# Patient Record
Sex: Male | Born: 1960 | Race: White | Hispanic: No | Smoking: Never smoker
Health system: Southern US, Community
[De-identification: ages and names within clinical notes are randomized; demographics above are authoritative.]

## PROBLEM LIST (undated history)

## (undated) DIAGNOSIS — F329 Major depressive disorder, single episode, unspecified: Secondary | ICD-10-CM

## (undated) DIAGNOSIS — K219 Gastro-esophageal reflux disease without esophagitis: Secondary | ICD-10-CM

## (undated) DIAGNOSIS — I10 Essential (primary) hypertension: Secondary | ICD-10-CM

## (undated) DIAGNOSIS — F32A Depression, unspecified: Secondary | ICD-10-CM

## (undated) DIAGNOSIS — F988 Other specified behavioral and emotional disorders with onset usually occurring in childhood and adolescence: Secondary | ICD-10-CM

## (undated) HISTORY — PX: VASECTOMY: SHX75

## (undated) HISTORY — PX: EYE SURGERY: SHX253

## (undated) HISTORY — DX: Gastro-esophageal reflux disease without esophagitis: K21.9

---
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
## 2004-11-28 ENCOUNTER — Ambulatory Visit: Payer: Self-pay | Admitting: Family Medicine

## 2011-09-24 ENCOUNTER — Encounter: Payer: Self-pay | Admitting: Family Medicine

## 2013-04-01 ENCOUNTER — Ambulatory Visit: Payer: Self-pay | Admitting: Family Medicine

## 2014-10-17 LAB — HEMOGLOBIN A1C: HEMOGLOBIN A1C: 5.7 % (ref 4.0–6.0)

## 2015-01-19 LAB — LIPID PANEL
CHOLESTEROL: 210 mg/dL — AB (ref 0–200)
HDL: 51 mg/dL (ref 35–70)
LDL Cholesterol: 148 mg/dL
TRIGLYCERIDES: 57 mg/dL (ref 40–160)

## 2015-01-19 LAB — HEPATIC FUNCTION PANEL
ALT: 30 U/L (ref 10–40)
AST: 28 U/L (ref 14–40)
Alkaline Phosphatase: 53 U/L (ref 25–125)
BILIRUBIN, TOTAL: 0.4 mg/dL

## 2015-01-19 LAB — CBC AND DIFFERENTIAL
HCT: 48 % (ref 41–53)
Hemoglobin: 15.4 g/dL (ref 13.5–17.5)
NEUTROS ABS: 54 /uL
Platelets: 255 10*3/uL (ref 150–399)
WBC: 3.8 10^3/mL

## 2015-01-19 LAB — BASIC METABOLIC PANEL
BUN: 20 mg/dL (ref 4–21)
CREATININE: 0.9 mg/dL (ref 0.6–1.3)
Glucose: 110 mg/dL
POTASSIUM: 5.1 mmol/L (ref 3.4–5.3)
SODIUM: 142 mmol/L (ref 137–147)

## 2015-01-19 LAB — TSH: TSH: 0.76 u[IU]/mL (ref 0.41–5.90)

## 2015-04-01 ENCOUNTER — Other Ambulatory Visit: Payer: Self-pay | Admitting: Family Medicine

## 2015-05-10 ENCOUNTER — Encounter: Payer: Self-pay | Admitting: Family Medicine

## 2015-07-05 ENCOUNTER — Other Ambulatory Visit: Payer: Self-pay

## 2015-07-05 MED ORDER — BUPROPION HCL ER (XL) 300 MG PO TB24: 300.0000 mg | ORAL_TABLET | Freq: Every day | ORAL | Status: DC

## 2015-07-18 DIAGNOSIS — R7303 Prediabetes: Secondary | ICD-10-CM | POA: Insufficient documentation

## 2015-07-18 DIAGNOSIS — F32A Depression, unspecified: Secondary | ICD-10-CM | POA: Insufficient documentation

## 2015-07-18 DIAGNOSIS — F988 Other specified behavioral and emotional disorders with onset usually occurring in childhood and adolescence: Secondary | ICD-10-CM | POA: Insufficient documentation

## 2015-07-18 DIAGNOSIS — F329 Major depressive disorder, single episode, unspecified: Secondary | ICD-10-CM | POA: Insufficient documentation

## 2015-07-18 DIAGNOSIS — R739 Hyperglycemia, unspecified: Secondary | ICD-10-CM | POA: Insufficient documentation

## 2015-07-18 DIAGNOSIS — M7540 Impingement syndrome of unspecified shoulder: Secondary | ICD-10-CM | POA: Insufficient documentation

## 2015-07-18 DIAGNOSIS — E785 Hyperlipidemia, unspecified: Secondary | ICD-10-CM | POA: Insufficient documentation

## 2015-07-18 DIAGNOSIS — K219 Gastro-esophageal reflux disease without esophagitis: Secondary | ICD-10-CM | POA: Insufficient documentation

## 2015-07-18 DIAGNOSIS — D1720 Benign lipomatous neoplasm of skin and subcutaneous tissue of unspecified limb: Secondary | ICD-10-CM | POA: Insufficient documentation

## 2015-07-19 ENCOUNTER — Ambulatory Visit (INDEPENDENT_AMBULATORY_CARE_PROVIDER_SITE_OTHER): Payer: BC Managed Care – PPO | Admitting: Family Medicine

## 2015-07-19 ENCOUNTER — Encounter: Payer: Self-pay | Admitting: Family Medicine

## 2015-07-19 VITALS — BP 118/60 | HR 60 | Temp 98.6°F | Resp 16 | Ht 65.0 in | Wt 158.0 lb

## 2015-07-19 DIAGNOSIS — F329 Major depressive disorder, single episode, unspecified: Secondary | ICD-10-CM | POA: Diagnosis not present

## 2015-07-19 DIAGNOSIS — F32A Depression, unspecified: Secondary | ICD-10-CM

## 2015-07-19 MED ORDER — SERTRALINE HCL 50 MG PO TABS: 50.0000 mg | ORAL_TABLET | Freq: Every day | ORAL | Status: DC

## 2015-07-19 NOTE — Progress Notes (Signed)
Patient ID: Darryl Larson, male   DOB: [birth year], 54 y.o.   MRN: [id number]       Patient: Darryl Larson Male    DOB: Dec 17, 1960   54 y.o.   MRN: [id number] Visit Date: 07/20/2015  Today's Provider: Wilhemena Durie, MD   Chief Complaint  Patient presents with  . Depression    6 month f/u.    Subjective:    Depression        This is a chronic problem.  Associated symptoms include decreased concentration, fatigue, insomnia and appetite change.  Associated symptoms include no helplessness, no hopelessness, not irritable, no restlessness, no decreased interest, no body aches, no myalgias, no indigestion, not sad and no suicidal ideas.  Compliance with treatment is good.  Previous treatment provided moderate relief. Patient is here for his 6 month follow up. Patient mentions that he feels really fatigued, and has trouble finding interest in things. Patient currently takes Wellbutrin 300mg  once daily.      Allergies no known allergies Previous Medications   ASPIRIN 81 MG TABLET    Take by mouth.   BUPROPION (WELLBUTRIN XL) 300 MG 24 HR TABLET    Take 1 tablet (300 mg total) by mouth daily.   CINNAMON 500 MG TABS    Take by mouth.   CLOBETASOL CREAM (TEMOVATE) 0.05 %    APPLY TWICE DAILY TO RASH   FLUTICASONE (FLONASE) 50 MCG/ACT NASAL SPRAY    USE 2 SPRAYS IN EACH NOSTRIL EVERY DAY   HYDROCORTISONE (ANUSOL-HC) 25 MG SUPPOSITORY    Place rectally.   MELATONIN 1 MG TABS    Take by mouth.   MULTIPLE VITAMIN PO    Take by mouth.   NAPROXEN (NAPROSYN) 500 MG TABLET    Take by mouth.    Review of Systems  Constitutional: Positive for appetite change and fatigue.  HENT: Negative.   Respiratory: Negative.   Cardiovascular: Negative.   Gastrointestinal: Negative.   Musculoskeletal: Negative for myalgias.  Psychiatric/Behavioral: Positive for depression, dysphoric mood and decreased concentration. Negative for suicidal ideas. The patient has insomnia.     Social History  Substance  Use Topics  . Smoking status: Never Smoker   . Smokeless tobacco: Former Systems developer  . Alcohol Use: 0.0 oz/week    0 Standard drinks or equivalent per week     Comment: OCCASIONALLY   Objective:   BP 118/60 mmHg  Pulse 60  Temp(Src) 98.6 F (37 C)  Resp 16  Ht 5\' 5"  (1.651 m)  Wt 158 lb (71.668 kg)  BMI 26.29 kg/m2  Physical Exam  Constitutional: He is oriented to person, place, and time. He appears well-developed and well-nourished. He is not irritable.  HENT:  Head: Normocephalic and atraumatic.  Right Ear: External ear normal.  Left Ear: External ear normal.  Nose: Nose normal.  Eyes: Conjunctivae are normal.  Neck: Neck supple.  Cardiovascular: Normal rate, regular rhythm and normal heart sounds.   Pulmonary/Chest: Effort normal and breath sounds normal.  Abdominal: Soft.  Neurological: He is alert and oriented to person, place, and time.  Skin: Skin is warm and dry.  Psychiatric: He has a normal mood and affect. His behavior is normal. Judgment and thought content normal.        Assessment & Plan:       Depression Start Sertraline 50mg  daily--RTC 1 month.     I have done the exam and reviewed the above chart and it is accurate to the best of  my knowledge.   Denene Alamillo Cranford Mon, MD  [facility]

## 2015-07-20 ENCOUNTER — Telehealth: Payer: Self-pay | Admitting: Family Medicine

## 2015-07-20 NOTE — Telephone Encounter (Signed)
Please review-aa 

## 2015-07-20 NOTE — Telephone Encounter (Signed)
Pt advised-aa 

## 2015-07-20 NOTE — Telephone Encounter (Signed)
Both

## 2015-07-20 NOTE — Telephone Encounter (Signed)
Pt came in yesterday and you gave him generic Zoloft. Does he need to discontinue the Wellbutrin or take both  Please Advise  .[place]

## 2015-08-23 ENCOUNTER — Ambulatory Visit (INDEPENDENT_AMBULATORY_CARE_PROVIDER_SITE_OTHER): Payer: BC Managed Care – PPO | Admitting: Family Medicine

## 2015-08-23 VITALS — BP 106/56 | HR 64 | Temp 98.1°F | Resp 14 | Ht 65.0 in | Wt 158.0 lb

## 2015-08-23 DIAGNOSIS — F329 Major depressive disorder, single episode, unspecified: Secondary | ICD-10-CM | POA: Diagnosis not present

## 2015-08-23 DIAGNOSIS — F32A Depression, unspecified: Secondary | ICD-10-CM

## 2015-08-23 MED ORDER — DULOXETINE HCL 20 MG PO CPEP: 20.0000 mg | ORAL_CAPSULE | Freq: Every day | ORAL | Status: DC

## 2015-08-23 NOTE — Progress Notes (Signed)
Patient ID: Darryl Larson, male   DOB: [birth year], 54 y.o.   MRN: [id number]   Darryl Larson  MRN: [id number] DOB: 15-Mar-1961  Subjective:  HPI   1. Depression Patient is a 54 year old male who presents for follow up on his depression.  His last office visit was on 07/19/15.  At that time the management changes included adding Sertraline 50 mg daily to his Bupropion.  Patient has been on this for about 1 month now but states he does not see any improvement in his symptoms.   Patient Active Problem List   Diagnosis Date Noted  . Adult attention deficit disorder 07/18/2015  . Clinical depression 07/18/2015  . Gastro-esophageal reflux disease without esophagitis 07/18/2015  . Borderline diabetes 07/18/2015  . Blood glucose elevated 07/18/2015  . HLD (hyperlipidemia) 07/18/2015  . Impingement syndrome of shoulder 07/18/2015  . Lipoma of shoulder 07/18/2015    No past medical history on file.  Social History   Social History  . Marital Status: Divorced    Spouse Name: N/A  . Number of Children: N/A  . Years of Education: N/A   Occupational History  . Not on file.   Social History Main Topics  . Smoking status: Never Smoker   . Smokeless tobacco: Former Systems developer  . Alcohol Use: 0.0 oz/week    0 Standard drinks or equivalent per week     Comment: OCCASIONALLY  . Drug Use: No  . Sexual Activity: Not on file   Other Topics Concern  . Not on file   Social History Narrative    Outpatient Prescriptions Prior to Visit  Medication Sig Dispense Refill  . aspirin 81 MG tablet Take by mouth.    Marland Kitchen buPROPion (WELLBUTRIN XL) 300 MG 24 hr tablet Take 1 tablet (300 mg total) by mouth daily. 90 tablet 3  . Cinnamon 500 MG TABS Take by mouth.    . clobetasol cream (TEMOVATE) 0.05 % APPLY TWICE DAILY TO RASH  3  . fluticasone (FLONASE) 50 MCG/ACT nasal spray USE 2 SPRAYS IN EACH NOSTRIL EVERY DAY 15 g 12  . hydrocortisone (ANUSOL-HC) 25 MG suppository Place rectally.    . Melatonin 1  MG TABS Take by mouth.    . MULTIPLE VITAMIN PO Take by mouth.    . naproxen (NAPROSYN) 500 MG tablet Take by mouth.    . sertraline (ZOLOFT) 50 MG tablet Take 1 tablet (50 mg total) by mouth daily. 30 tablet 12   No facility-administered medications prior to visit.    No Known Allergies  Review of Systems  Constitutional: Positive for malaise/fatigue (chronic and unchanged). Negative for fever, chills, weight loss and diaphoresis.  Respiratory: Negative.   Cardiovascular: Negative.   Neurological: Negative for dizziness, weakness and headaches.  Psychiatric/Behavioral: Positive for depression and memory loss. Negative for suicidal ideas, hallucinations and substance abuse. The patient is not nervous/anxious and does not have insomnia.    Objective:  BP 106/56 mmHg  Pulse 64  Temp(Src) 98.1 F (36.7 C) (Oral)  Resp 14  Ht 5\' 5"  (1.651 m)  Wt 158 lb (71.668 kg)  BMI 26.29 kg/m2  Physical Exam  Constitutional: He is well-developed, well-nourished, and in no distress.  HENT:  Head: Normocephalic and atraumatic.  Eyes: Pupils are equal, round, and reactive to light.  Neck: Normal range of motion.  Cardiovascular: Normal rate, regular rhythm and normal heart sounds.   Pulmonary/Chest: Effort normal and breath sounds normal.  Skin: Skin is warm and dry.  Psychiatric: Mood, memory, affect and judgment normal.    Assessment and Plan :   1. Depression Patient is to discontinue the Sertraline and start Duloxetine with his next dose.  He has been advised to take with food.  Pt to f/u with counselling with [facility].  Will follow up in 1 month  - DULoxetine (CYMBALTA) 20 MG capsule; Take 1 capsule (20 mg total) by mouth daily.  Dispense: 30 capsule; Refill: [facility] 08/23/2015 3:51 PM

## 2015-09-11 ENCOUNTER — Ambulatory Visit (INDEPENDENT_AMBULATORY_CARE_PROVIDER_SITE_OTHER): Payer: BC Managed Care – PPO | Admitting: Family Medicine

## 2015-09-11 VITALS — BP 128/80 | HR 64 | Temp 98.0°F | Resp 16 | Wt 161.0 lb

## 2015-09-11 DIAGNOSIS — J019 Acute sinusitis, unspecified: Secondary | ICD-10-CM

## 2015-09-11 MED ORDER — AMOXICILLIN-POT CLAVULANATE 875-125 MG PO TABS: 1.0000 | ORAL_TABLET | Freq: Two times a day (BID) | ORAL | Status: DC

## 2015-09-11 NOTE — Progress Notes (Signed)
Patient ID: BRAXSTON BELLEW, male   DOB: 12/09/1960, 54 y.o.   MRN: [id number]   MARQUINN DIETEL  MRN: [id number] DOB: Aug 14, 1961  Subjective:  HPI   1. Acute sinusitis, recurrence not specified, unspecified location Patient is a 54 year old male who presents for evaluation of sinus symptoms.  He reports that 3 days ago he started with sinus drainage, cough and sinus pressure.  His cough is productive of green sputum.  Patient Active Problem List   Diagnosis Date Noted  . Adult attention deficit disorder 07/18/2015  . Clinical depression 07/18/2015  . Gastro-esophageal reflux disease without esophagitis 07/18/2015  . Borderline diabetes 07/18/2015  . Blood glucose elevated 07/18/2015  . HLD (hyperlipidemia) 07/18/2015  . Impingement syndrome of shoulder 07/18/2015  . Lipoma of shoulder 07/18/2015    No past medical history on file.  Social History   Social History  . Marital Status: Divorced    Spouse Name: N/A  . Number of Children: N/A  . Years of Education: N/A   Occupational History  . Not on file.   Social History Main Topics  . Smoking status: Never Smoker   . Smokeless tobacco: Former Systems developer  . Alcohol Use: 0.0 oz/week    0 Standard drinks or equivalent per week     Comment: OCCASIONALLY  . Drug Use: No  . Sexual Activity: Not on file   Other Topics Concern  . Not on file   Social History Narrative    Outpatient Prescriptions Prior to Visit  Medication Sig Dispense Refill  . aspirin 81 MG tablet Take by mouth.    Marland Kitchen buPROPion (WELLBUTRIN XL) 300 MG 24 hr tablet Take 1 tablet (300 mg total) by mouth daily. 90 tablet 3  . Cinnamon 500 MG TABS Take by mouth.    . clobetasol cream (TEMOVATE) 0.05 % APPLY TWICE DAILY TO RASH  3  . DULoxetine (CYMBALTA) 20 MG capsule Take 1 capsule (20 mg total) by mouth daily. 30 capsule 3  . fluticasone (FLONASE) 50 MCG/ACT nasal spray USE 2 SPRAYS IN EACH NOSTRIL EVERY DAY 15 g 12  . hydrocortisone (ANUSOL-HC) 25 MG  suppository Place rectally.    . Melatonin 1 MG TABS Take by mouth.    . MULTIPLE VITAMIN PO Take by mouth.    . naproxen (NAPROSYN) 500 MG tablet Take by mouth.     No facility-administered medications prior to visit.    No Known Allergies  Review of Systems  Constitutional: Negative for fever, chills, weight loss, malaise/fatigue and diaphoresis.  HENT: Positive for congestion and sore throat. Negative for ear discharge, ear pain, hearing loss, nosebleeds and tinnitus.   Eyes: Negative for blurred vision, double vision, photophobia, pain, discharge and redness.  Respiratory: Positive for cough and sputum production. Negative for hemoptysis, shortness of breath, wheezing and stridor.   Cardiovascular: Negative for chest pain, palpitations, orthopnea and leg swelling.  Gastrointestinal: Negative.   Genitourinary: Negative.   Neurological: Negative for weakness and headaches.  Endo/Heme/Allergies: Negative.   Psychiatric/Behavioral: Negative.    Objective:  BP 128/80 mmHg  Pulse 64  Temp(Src) 98 F (36.7 C) (Oral)  Resp 16  Wt 161 lb (73.029 kg)  Physical Exam  Constitutional: He is oriented to person, place, and time and well-developed, well-nourished, and in no distress.  HENT:  Head: Normocephalic and atraumatic.  Right Ear: External ear normal.  Left Ear: External ear normal.  Nose: Nose normal.  Mouth/Throat: Oropharynx is clear and moist.  Eyes: Conjunctivae  are normal.  Neck: Neck supple.  Cardiovascular: Normal rate, regular rhythm and normal heart sounds.   Pulmonary/Chest: Effort normal and breath sounds normal.  Abdominal: Soft.  Neurological: He is alert and oriented to person, place, and time.  Skin: Skin is warm and dry.  Psychiatric: Mood, memory, affect and judgment normal.    Assessment and Plan :  Acute sinusitis, recurrence not specified, unspecified location Augmentin BID. Miguel Aschoff MD [facility] 09/11/2015 4:52 PM

## 2015-09-27 ENCOUNTER — Ambulatory Visit: Payer: BC Managed Care – PPO | Admitting: Family Medicine

## 2015-11-06 ENCOUNTER — Other Ambulatory Visit: Payer: Self-pay

## 2015-11-25 ENCOUNTER — Encounter: Payer: Self-pay | Admitting: Family Medicine

## 2015-11-25 ENCOUNTER — Ambulatory Visit (INDEPENDENT_AMBULATORY_CARE_PROVIDER_SITE_OTHER): Payer: BC Managed Care – PPO | Admitting: Family Medicine

## 2015-11-25 VITALS — BP 124/60 | HR 56 | Temp 98.2°F | Resp 16 | Wt 162.0 lb

## 2015-11-25 DIAGNOSIS — R7303 Prediabetes: Secondary | ICD-10-CM | POA: Diagnosis not present

## 2015-11-25 DIAGNOSIS — J40 Bronchitis, not specified as acute or chronic: Secondary | ICD-10-CM

## 2015-11-25 MED ORDER — AZITHROMYCIN 250 MG PO TABS: ORAL_TABLET | ORAL | Status: DC

## 2015-11-25 NOTE — Progress Notes (Signed)
Patient ID: Darryl Larson, male   DOB: [birth year], 55 y.o.   MRN: [id number]       Patient: Darryl Larson Male    DOB: 09-Aug-1961   55 y.o.   MRN: [id number] Visit Date: 11/26/2015  Today's Provider: Margarita Rana, MD   Chief Complaint  Patient presents with  . URI    X 5 days.    Subjective:    URI  This is a new problem. The current episode started in the past 7 days. The problem has been gradually worsening. There has been no fever. Associated symptoms include congestion, coughing, headaches, rhinorrhea, sinus pain and a sore throat. Pertinent negatives include no chest pain, joint pain, neck pain, plugged ear sensation or wheezing. He has tried decongestant and antihistamine for the symptoms. The treatment provided no relief.       No Known Allergies Previous Medications   ASPIRIN 81 MG TABLET    Take by mouth.   BUPROPION (WELLBUTRIN XL) 300 MG 24 HR TABLET    Take 1 tablet (300 mg total) by mouth daily.   CINNAMON 500 MG TABS    Take by mouth.   CLOBETASOL CREAM (TEMOVATE) 0.05 %    APPLY TWICE DAILY TO RASH   DULOXETINE (CYMBALTA) 20 MG CAPSULE    Take 1 capsule (20 mg total) by mouth daily.   FLUTICASONE (FLONASE) 50 MCG/ACT NASAL SPRAY    USE 2 SPRAYS IN EACH NOSTRIL EVERY DAY   HYDROCORTISONE (ANUSOL-HC) 25 MG SUPPOSITORY    Place rectally.   MELATONIN 1 MG TABS    Take by mouth.   MULTIPLE VITAMIN PO    Take by mouth.   NAPROXEN (NAPROSYN) 500 MG TABLET    Take by mouth.    Review of Systems  HENT: Positive for congestion, rhinorrhea and sore throat.   Respiratory: Positive for cough. Negative for wheezing.   Cardiovascular: Negative for chest pain.  Musculoskeletal: Negative for joint pain and neck pain.  Neurological: Positive for headaches.    Social History  Substance Use Topics  . Smoking status: Never Smoker   . Smokeless tobacco: Former Systems developer  . Alcohol Use: 0.0 oz/week    0 Standard drinks or equivalent per week     Comment: OCCASIONALLY    Objective:   BP 124/60 mmHg  Pulse 56  Temp(Src) 98.2 F (36.8 C)  Resp 16  Wt 162 lb (73.483 kg)  SpO2 97%  Physical Exam  Constitutional: He is oriented to person, place, and time. He appears well-developed and well-nourished.  HENT:  Head: Normocephalic and atraumatic.  Right Ear: Tympanic membrane and external ear normal.  Left Ear: Tympanic membrane and external ear normal.  Nose: Mucosal edema and rhinorrhea present. Right sinus exhibits no maxillary sinus tenderness. Left sinus exhibits no maxillary sinus tenderness.  Mouth/Throat: Uvula is midline and oropharynx is clear and moist.  Eyes: Conjunctivae and EOM are normal. Pupils are equal, round, and reactive to light.  Neck: Normal range of motion. Neck supple.  Cardiovascular: Normal rate and regular rhythm.   Pulmonary/Chest: Effort normal and breath sounds normal. He has no wheezes. He has no rales.  Neurological: He is alert and oriented to person, place, and time.        Assessment & Plan:     1. Bronchitis New problem. Condition is worsening. Will start medication for better control.  Has borderline diabetes, so will treat slightly more aggressively. Just not improving. Patient instructed to call back if condition  worsens or does not improve.    - azithromycin (ZITHROMAX) 250 MG tablet; 2 today and then one a day for 4 more days.  Dispense: 6 tablet; Refill: 0  2. Borderline diabetes Follow up with PCP.         Margarita Rana, MD  [facility]

## 2015-12-11 ENCOUNTER — Other Ambulatory Visit: Payer: Self-pay

## 2015-12-11 DIAGNOSIS — F329 Major depressive disorder, single episode, unspecified: Secondary | ICD-10-CM

## 2015-12-11 DIAGNOSIS — F32A Depression, unspecified: Secondary | ICD-10-CM

## 2015-12-11 MED ORDER — DULOXETINE HCL 20 MG PO CPEP: 20.0000 mg | ORAL_CAPSULE | Freq: Every day | ORAL | Status: DC

## 2016-01-22 ENCOUNTER — Encounter: Payer: Self-pay | Admitting: Family Medicine

## 2016-01-22 ENCOUNTER — Ambulatory Visit (INDEPENDENT_AMBULATORY_CARE_PROVIDER_SITE_OTHER): Payer: BC Managed Care – PPO | Admitting: Family Medicine

## 2016-01-22 VITALS — BP 118/64 | HR 60 | Temp 98.4°F | Resp 12 | Ht 64.5 in | Wt 163.0 lb

## 2016-01-22 DIAGNOSIS — Z1211 Encounter for screening for malignant neoplasm of colon: Secondary | ICD-10-CM | POA: Diagnosis not present

## 2016-01-22 DIAGNOSIS — F329 Major depressive disorder, single episode, unspecified: Secondary | ICD-10-CM

## 2016-01-22 DIAGNOSIS — E785 Hyperlipidemia, unspecified: Secondary | ICD-10-CM | POA: Diagnosis not present

## 2016-01-22 DIAGNOSIS — G478 Other sleep disorders: Secondary | ICD-10-CM

## 2016-01-22 DIAGNOSIS — Z0000 Encounter for general adult medical examination without abnormal findings: Secondary | ICD-10-CM | POA: Diagnosis not present

## 2016-01-22 DIAGNOSIS — R7303 Prediabetes: Secondary | ICD-10-CM

## 2016-01-22 DIAGNOSIS — F32A Depression, unspecified: Secondary | ICD-10-CM

## 2016-01-22 DIAGNOSIS — Z125 Encounter for screening for malignant neoplasm of prostate: Secondary | ICD-10-CM | POA: Diagnosis not present

## 2016-01-22 DIAGNOSIS — M7702 Medial epicondylitis, left elbow: Secondary | ICD-10-CM | POA: Diagnosis not present

## 2016-01-22 NOTE — Progress Notes (Signed)
Patient ID: Darryl Larson, male   DOB: 05-29-1961, 55 y.o.   MRN: [id number]  Visit Date: 01/22/2016  Today's Provider: Wilhemena Durie, MD   Chief Complaint  Patient presents with  . Annual Exam   Subjective:  Darryl Larson is a 55 y.o. male who presents today for health maintenance and complete physical. He feels well. He stays active daily with house and yard work.Marland Kitchen He reports he is sleeping poorly. Immunization History  Administered Date(s) Administered  . Pneumococcal Polysaccharide-23 12/02/2012  . Tdap 11/27/2011   Last colonoscopy 09/24/11 diverticulosis, tubular adenoma.  Review of Systems  Constitutional: Negative.   HENT: Negative.   Eyes: Negative.   Respiratory: Negative.   Cardiovascular: Negative.   Gastrointestinal: Positive for constipation.  Endocrine: Negative.   Genitourinary: Negative.   Musculoskeletal: Positive for back pain and neck stiffness.  Skin: Negative.   Allergic/Immunologic: Negative.   Neurological: Negative.   Hematological: Negative.   Psychiatric/Behavioral: Positive for sleep disturbance.    Social History   Social History  . Marital Status: Divorced    Spouse Name: N/A  . Number of Children: N/A  . Years of Education: N/A   Occupational History  . Not on file.   Social History Main Topics  . Smoking status: Never Smoker   . Smokeless tobacco: Former Systems developer    Types: Chew  . Alcohol Use: 0.0 oz/week    0 Standard drinks or equivalent per week     Comment: 3 to 4 drinks a week  . Drug Use: No  . Sexual Activity: Not on file   Other Topics Concern  . Not on file   Social History Narrative    Patient Active Problem List   Diagnosis Date Noted  . Adult attention deficit disorder 07/18/2015  . Clinical depression 07/18/2015  . Gastro-esophageal reflux disease without esophagitis 07/18/2015  . Borderline diabetes 07/18/2015  . Blood glucose elevated 07/18/2015  . HLD (hyperlipidemia) 07/18/2015  . Impingement  syndrome of shoulder 07/18/2015  . Lipoma of shoulder 07/18/2015    Past Surgical History  Procedure Laterality Date  . Vasectomy      His family history includes Alcohol abuse in his father; Breast cancer in his mother; Cancer in his paternal aunt; Depression in his brother, brother, and father; Diabetes in his mother; Healthy in his brother and daughter; Hodgkin's lymphoma in his paternal uncle; Pancreatic cancer in his mother; Throat cancer in his father.    Outpatient Prescriptions Prior to Visit  Medication Sig Dispense Refill  . aspirin 81 MG tablet Take by mouth.    Marland Kitchen buPROPion (WELLBUTRIN XL) 300 MG 24 hr tablet Take 1 tablet (300 mg total) by mouth daily. 90 tablet 3  . Cinnamon 500 MG TABS Take by mouth.    . clobetasol cream (TEMOVATE) 0.05 % APPLY TWICE DAILY TO RASH  3  . DULoxetine (CYMBALTA) 20 MG capsule Take 1 capsule (20 mg total) by mouth daily. 90 capsule 1  . fluticasone (FLONASE) 50 MCG/ACT nasal spray USE 2 SPRAYS IN EACH NOSTRIL EVERY DAY 15 g 12  . MULTIPLE VITAMIN PO Take by mouth.    . Melatonin 1 MG TABS Take by mouth. Reported on 01/22/2016    . azithromycin (ZITHROMAX) 250 MG tablet 2 today and then one a day for 4 more days. 6 tablet 0  . hydrocortisone (ANUSOL-HC) 25 MG suppository Place rectally.    . naproxen (NAPROSYN) 500 MG tablet Take by mouth.     No  facility-administered medications prior to visit.    Patient Care Team: [name]., MD as PCP - General (Family Medicine)     Objective:   Vitals:  Filed Vitals:   01/22/16 0913  BP: 118/64  Pulse: 60  Temp: 98.4 F (36.9 C)  Resp: 12  Height: 5' 4.5" (1.638 m)  Weight: 163 lb (73.936 kg)    Physical Exam  Constitutional: He is oriented to person, place, and time. He appears well-developed and well-nourished.  HENT:  Head: Normocephalic and atraumatic.  Right Ear: External ear normal.  Left Ear: External ear normal.  Nose: Nose normal.  Mouth/Throat: Oropharynx is clear  and moist.  Eyes: Conjunctivae are normal. Pupils are equal, round, and reactive to light.  Neck: Normal range of motion. Neck supple.  Cardiovascular: Normal rate, regular rhythm, normal heart sounds and intact distal pulses.   No murmur heard. Pulmonary/Chest: Effort normal and breath sounds normal. No respiratory distress.  Abdominal: Soft. He exhibits no distension. There is no tenderness.  Genitourinary: Rectum normal, prostate normal and penis normal. Guaiac negative stool. No penile tenderness.  Musculoskeletal: Normal range of motion. He exhibits no edema.  Neurological: He is alert and oriented to person, place, and time. No cranial nerve deficit.  Skin: Skin is warm and dry. No rash noted. No erythema.  Psychiatric: He has a normal mood and affect. His behavior is normal. Judgment and thought content normal.  golf ball size lipoma over right AC  Joint.   Depression Screen PHQ 2/9 Scores 01/22/2016 07/19/2015  PHQ - 2 Score 1 3  PHQ- 9 Score - 14      Assessment & Plan:   1. Annual physical exam - CBC with Differential/Platelet - Comprehensive metabolic panel - Lipid Panel With LDL/HDL Ratio - POCT Urinalysis Dipstick - TSH  2. Colon cancer screening - IFOBT POC (occult bld, rslt in office)  3. Prostate cancer screening - PSA  4. Borderline diabetes - HgB A1c  5. Clinical depression Stable.  6. HLD (hyperlipidemia)  7. Epicondylitis elbow, medial, left New. Refer to Dr. Mack Guise. - Ambulatory referral to Orthopedic Surgery  8. Poor sleep pattern Discussed working on sleep hygiene methods and will re check in June. Epworth today is 15. Patient wants to follow for now. 9 lipoma over right AC joint I have done the exam and reviewed the above chart and it is accurate to the best of my knowledge.  Patient was seen and examined by Dr. Eulas Post and note was scribed by Theressa Millard, RMA.

## 2016-01-30 LAB — COMPREHENSIVE METABOLIC PANEL
A/G RATIO: 1.8 (ref 1.2–2.2)
ALK PHOS: 62 IU/L (ref 39–117)
ALT: 29 IU/L (ref 0–44)
AST: 30 IU/L (ref 0–40)
Albumin: 4.4 g/dL (ref 3.5–5.5)
BILIRUBIN TOTAL: 0.5 mg/dL (ref 0.0–1.2)
BUN/Creatinine Ratio: 15 (ref 9–20)
BUN: 14 mg/dL (ref 6–24)
CALCIUM: 9.7 mg/dL (ref 8.7–10.2)
CO2: 28 mmol/L (ref 18–29)
Chloride: 101 mmol/L (ref 96–106)
Creatinine, Ser: 0.95 mg/dL (ref 0.76–1.27)
GFR calc Af Amer: 104 mL/min/{1.73_m2} (ref >59)
GFR calc non Af Amer: 90 mL/min/{1.73_m2} (ref >59)
Globulin, Total: 2.4 g/dL (ref 1.5–4.5)
Glucose: 122 mg/dL — ABNORMAL HIGH (ref 65–99)
POTASSIUM: 5.1 mmol/L (ref 3.5–5.2)
Sodium: 144 mmol/L (ref 134–144)
Total Protein: 6.8 g/dL (ref 6.0–8.5)

## 2016-01-30 LAB — LIPID PANEL WITH LDL/HDL RATIO
CHOLESTEROL TOTAL: 249 mg/dL — AB (ref 100–199)
HDL: 56 mg/dL (ref >39)
LDL Calculated: 168 mg/dL — ABNORMAL HIGH (ref 0–99)
LDl/HDL Ratio: 3 ratio units (ref 0.0–3.6)
Triglycerides: 125 mg/dL (ref 0–149)
VLDL Cholesterol Cal: 25 mg/dL (ref 5–40)

## 2016-01-30 LAB — CBC WITH DIFFERENTIAL/PLATELET
BASOS: 1 %
Basophils Absolute: 0 10*3/uL (ref 0.0–0.2)
EOS (ABSOLUTE): 0.1 10*3/uL (ref 0.0–0.4)
Eos: 3 %
Hematocrit: 49.5 % (ref 37.5–51.0)
Hemoglobin: 16.6 g/dL (ref 12.6–17.7)
IMMATURE GRANULOCYTES: 0 %
Immature Grans (Abs): 0 10*3/uL (ref 0.0–0.1)
Lymphocytes Absolute: 1.4 10*3/uL (ref 0.7–3.1)
Lymphs: 31 %
MCH: 30.2 pg (ref 26.6–33.0)
MCHC: 33.5 g/dL (ref 31.5–35.7)
MCV: 90 fL (ref 79–97)
MONOS ABS: 0.5 10*3/uL (ref 0.1–0.9)
Monocytes: 11 %
NEUTROS PCT: 54 %
Neutrophils Absolute: 2.3 10*3/uL (ref 1.4–7.0)
PLATELETS: 259 10*3/uL (ref 150–379)
RBC: 5.5 x10E6/uL (ref 4.14–5.80)
RDW: 13 % (ref 12.3–15.4)
WBC: 4.4 10*3/uL (ref 3.4–10.8)

## 2016-01-30 LAB — HEMOGLOBIN A1C
ESTIMATED AVERAGE GLUCOSE: 126 mg/dL
Hgb A1c MFr Bld: 6 % — ABNORMAL HIGH (ref 4.8–5.6)

## 2016-01-30 LAB — TSH: TSH: 0.894 u[IU]/mL (ref 0.450–4.500)

## 2016-01-30 LAB — PSA: Prostate Specific Ag, Serum: 0.8 ng/mL (ref 0.0–4.0)

## 2016-02-07 ENCOUNTER — Telehealth: Payer: Self-pay

## 2016-02-07 NOTE — Telephone Encounter (Signed)
-----   Message from [name]., MD sent at 02/06/2016  8:39 AM EDT ----- Labs stable. Prediabetes stable.

## 2016-02-07 NOTE — Telephone Encounter (Signed)
Patient advised as directed below. 

## 2016-04-01 ENCOUNTER — Encounter (INDEPENDENT_AMBULATORY_CARE_PROVIDER_SITE_OTHER): Payer: BC Managed Care – PPO | Admitting: Ophthalmology

## 2016-04-01 DIAGNOSIS — H35373 Puckering of macula, bilateral: Secondary | ICD-10-CM

## 2016-04-01 DIAGNOSIS — H43813 Vitreous degeneration, bilateral: Secondary | ICD-10-CM | POA: Diagnosis not present

## 2016-04-04 ENCOUNTER — Other Ambulatory Visit: Payer: Self-pay

## 2016-04-04 MED ORDER — FLUTICASONE PROPIONATE 50 MCG/ACT NA SUSP: 2.0000 | Freq: Every day | NASAL | Status: DC | PRN

## 2016-04-10 ENCOUNTER — Ambulatory Visit: Payer: BC Managed Care – PPO | Admitting: Family Medicine

## 2016-04-16 LAB — HM DIABETES EYE EXAM

## 2016-06-12 ENCOUNTER — Other Ambulatory Visit: Payer: Self-pay | Admitting: Family Medicine

## 2016-07-17 ENCOUNTER — Ambulatory Visit (INDEPENDENT_AMBULATORY_CARE_PROVIDER_SITE_OTHER): Payer: BC Managed Care – PPO | Admitting: Family Medicine

## 2016-07-17 VITALS — BP 132/62 | HR 72 | Temp 98.4°F | Resp 16 | Wt 162.0 lb

## 2016-07-17 DIAGNOSIS — M898X1 Other specified disorders of bone, shoulder: Secondary | ICD-10-CM | POA: Diagnosis not present

## 2016-07-17 DIAGNOSIS — F329 Major depressive disorder, single episode, unspecified: Secondary | ICD-10-CM | POA: Diagnosis not present

## 2016-07-17 DIAGNOSIS — F32A Depression, unspecified: Secondary | ICD-10-CM

## 2016-07-17 MED ORDER — NAPROXEN 500 MG PO TABS: 500.0000 mg | ORAL_TABLET | Freq: Two times a day (BID) | ORAL | 12 refills | Status: DC | PRN

## 2016-07-17 NOTE — Progress Notes (Signed)
Darryl Larson  MRN: [id number] DOB: 02-16-1961  Subjective:  HPI  Patient states he has had pain in the shoulder blade on the right for 4 weeks now. He has taking Ibuprofen 2 tablets daily, applied heat and ice off and on. He is having hard time sleeping due to the pain, also decreased range of motion with right arm. Denies numbness and tingling sensation. He states he is active every day at work and home and does lifting and pulling and mowing. He does not recall any specific injury for his symptoms now.  Patient Active Problem List   Diagnosis Date Noted  . Adult attention deficit disorder 07/18/2015  . Clinical depression 07/18/2015  . Gastro-esophageal reflux disease without esophagitis 07/18/2015  . Borderline diabetes 07/18/2015  . Blood glucose elevated 07/18/2015  . HLD (hyperlipidemia) 07/18/2015  . Impingement syndrome of shoulder 07/18/2015  . Lipoma of shoulder 07/18/2015    No past medical history on file.  Social History   Social History  . Marital status: Divorced    Spouse name: N/A  . Number of children: N/A  . Years of education: N/A   Occupational History  . Not on file.   Social History Main Topics  . Smoking status: Never Smoker  . Smokeless tobacco: Former Systems developer    Types: Chew  . Alcohol use 0.0 oz/week     Comment: 3 to 4 drinks a week  . Drug use: No  . Sexual activity: Not on file   Other Topics Concern  . Not on file   Social History Narrative  . No narrative on file    Outpatient Encounter Prescriptions as of 07/17/2016  Medication Sig Note  . aspirin 81 MG tablet Take by mouth. 07/18/2015: Received from: [facility]  . buPROPion (WELLBUTRIN XL) 300 MG 24 hr tablet TAKE 1 TABLET (300 MG TOTAL) BY MOUTH DAILY.   Marland Kitchen Cinnamon 500 MG TABS Take by mouth. 07/18/2015: Received from: [facility]  . clobetasol cream (TEMOVATE) 0.05 % APPLY TWICE DAILY TO RASH 07/19/2015: Received from: External Pharmacy  .  DULoxetine (CYMBALTA) 20 MG capsule Take 1 capsule (20 mg total) by mouth daily.   . fluticasone (FLONASE) 50 MCG/ACT nasal spray Place 2 sprays into both nostrils daily as needed for allergies or rhinitis.   . Melatonin 1 MG TABS Take by mouth. Reported on 01/22/2016 07/18/2015: Received from: [facility]  . MULTIPLE VITAMIN PO Take by mouth. 07/18/2015: Received from: [facility]   No facility-administered encounter medications on file as of 07/17/2016.     No Known Allergies  Review of Systems  Constitutional: Negative.   Respiratory: Negative.   Cardiovascular: Negative.   Gastrointestinal: Negative.   Musculoskeletal: Positive for joint pain and myalgias.  Skin: Negative.   Psychiatric/Behavioral: Negative.    Objective:  BP 132/62   Pulse 72   Temp 98.4 F (36.9 C)   Resp 16   Wt 162 lb (73.5 kg)   BMI 27.38 kg/m   Physical Exam  Constitutional: He is oriented to person, place, and time and well-developed, well-nourished, and in no distress.  HENT:  Head: Normocephalic and atraumatic.  Right Ear: External ear normal.  Left Ear: External ear normal.  Nose: Nose normal.  Cardiovascular: Normal rate and regular rhythm.   Pulmonary/Chest: Effort normal and breath sounds normal.  Abdominal: Soft.  Musculoskeletal: He exhibits tenderness. He exhibits no edema.  Very mild tenderness and some mild swelling of the paraspinal muscles  just inferior and medial to the right scapula.  Neurological: He is alert and oriented to person, place, and time.  Skin: Skin is warm and dry.  No rash.  Psychiatric: Mood, memory, affect and judgment normal.    Assessment and Plan :  1. Pain of right scapula Treat with Naproxen. May need referral, will follow as needed. I think this is completely muscular. 2. Clinical depression   HPI, Exam and A&P transcribed under direction and in the presence of Miguel Aschoff, MD.

## 2016-07-18 ENCOUNTER — Encounter: Payer: Self-pay | Admitting: Urology

## 2016-07-18 ENCOUNTER — Ambulatory Visit (INDEPENDENT_AMBULATORY_CARE_PROVIDER_SITE_OTHER): Payer: BC Managed Care – PPO | Admitting: Urology

## 2016-07-18 VITALS — BP 136/89 | HR 59 | Ht 64.5 in | Wt 159.0 lb

## 2016-07-18 DIAGNOSIS — F5231 Female orgasmic disorder: Secondary | ICD-10-CM

## 2016-07-18 DIAGNOSIS — IMO0002 Reserved for concepts with insufficient information to code with codable children: Secondary | ICD-10-CM

## 2016-07-18 DIAGNOSIS — R3129 Other microscopic hematuria: Secondary | ICD-10-CM

## 2016-07-18 DIAGNOSIS — R37 Sexual dysfunction, unspecified: Secondary | ICD-10-CM

## 2016-07-18 LAB — MICROSCOPIC EXAMINATION
BACTERIA UA: NONE SEEN
Epithelial Cells (non renal): NONE SEEN /hpf (ref 0–10)

## 2016-07-18 LAB — URINALYSIS, COMPLETE
BILIRUBIN UA: NEGATIVE
Glucose, UA: NEGATIVE
Ketones, UA: NEGATIVE
NITRITE UA: NEGATIVE
PH UA: 6.5 (ref 5.0–7.5)
Protein, UA: NEGATIVE
Specific Gravity, UA: 1.025 (ref 1.005–1.030)
UUROB: 0.2 mg/dL (ref 0.2–1.0)

## 2016-07-18 NOTE — Progress Notes (Signed)
07/18/2016 3:27 PM   Darryl Larson 18-Feb-1961 [id number]  Referring provider: [name]., MD [address] [place], [place] [address]  Chief Complaint  Patient presents with  . New Patient (Initial Visit)    Erectile Dysfunction    HPI: The patient is a 55 year old gentleman with a past medical history of previous vasectomy presents today to discuss His inability to climax. He states that he has not been sexually active until recently. Now he is having a problem where he is able to get and maintain an erection without difficulty however he finds himself unable to have an orgasm. He sometimes is able to obtain orgasm when he macerated but it takes an extended period of time. He is never able to have an orgasm during sex. He is bothered by this. Of note, he is on Cymbalta and has been for approximately one year. He just started weaning this medication to come off of it yesterday.   PSA: 0.8 in April 2017.  PMH: Past Medical History:  Diagnosis Date  . Acid reflux     Surgical History: Past Surgical History:  Procedure Laterality Date  . VASECTOMY      Home Medications:    Medication List       Accurate as of 07/18/16  3:27 PM. Always use your most recent med list.          aspirin 81 MG tablet Take by mouth.   buPROPion 300 MG 24 hr tablet Commonly known as:  WELLBUTRIN XL TAKE 1 TABLET (300 MG TOTAL) BY MOUTH DAILY.   Cinnamon 500 MG Tabs Take by mouth.   clobetasol cream 0.05 % Commonly known as:  TEMOVATE APPLY TWICE DAILY TO RASH   DULoxetine 20 MG capsule Commonly known as:  CYMBALTA Take 1 capsule (20 mg total) by mouth daily.   fluticasone 50 MCG/ACT nasal spray Commonly known as:  FLONASE Place 2 sprays into both nostrils daily as needed for allergies or rhinitis.   Melatonin 1 MG Tabs Take by mouth. Reported on 01/22/2016   MULTIPLE VITAMIN PO Take by mouth.   naproxen 500 MG tablet Commonly known as:   NAPROSYN Take 1 tablet (500 mg total) by mouth 2 (two) times daily as needed.   Red Yeast Rice 600 MG Caps Take by mouth.   zinc sulfate 220 (50 Zn) MG capsule Take 220 mg by mouth daily.       Allergies: No Known Allergies  Family History: Family History  Problem Relation Age of Onset  . Breast cancer Mother   . Diabetes Mother   . Pancreatic cancer Mother   . Throat cancer Father   . Alcohol abuse Father   . Depression Father   . Healthy Brother   . Healthy Daughter   . Hodgkin's lymphoma Paternal Uncle   . Depression Brother   . Depression Brother   . Cancer Paternal Aunt   . Prostate cancer Neg Hx     Social History:  reports that he has never smoked. He has quit using smokeless tobacco. His smokeless tobacco use included Chew. He reports that he drinks alcohol. He reports that he does not use drugs.  ROS: UROLOGY Frequent Urination?: Yes Hard to postpone urination?: No Burning/pain with urination?: No Get up at night to urinate?: Yes Leakage of urine?: Yes Urine stream starts and stops?: No Trouble starting stream?: No Do you have to strain to urinate?: No Blood in urine?: No Urinary tract infection?: No  Sexually transmitted disease?: No Injury to kidneys or bladder?: No Painful intercourse?: No Weak stream?: Yes Erection problems?: Yes Penile pain?: Yes  Gastrointestinal Nausea?: No Vomiting?: No Indigestion/heartburn?: No Diarrhea?: No Constipation?: Yes  Constitutional Fever: No Night sweats?: No Weight loss?: No Fatigue?: No  Skin Skin rash/lesions?: No Itching?: No  Eyes Blurred vision?: No Double vision?: No  Ears/Nose/Throat Sore throat?: No Sinus problems?: No  Hematologic/Lymphatic Swollen glands?: No Easy bruising?: No  Cardiovascular Leg swelling?: No Chest pain?: No  Respiratory Cough?: No Shortness of breath?: No  Endocrine Excessive thirst?: No  Musculoskeletal Back pain?: Yes Joint pain?:  No  Neurological Headaches?: No Dizziness?: No  Psychologic Depression?: Yes Anxiety?: No  Physical Exam: BP 136/89   Pulse (!) 59   Ht 5' 4.5" (1.638 m)   Wt 159 lb (72.1 kg)   BMI 26.87 kg/m   Constitutional:  Alert and oriented, No acute distress. HEENT: [place] AT, moist mucus membranes.  Trachea midline, no masses. Cardiovascular: No clubbing, cyanosis, or edema. Respiratory: Normal respiratory effort, no increased work of breathing. GI: Abdomen is soft, nontender, nondistended, no abdominal masses GU: No CVA tenderness. Normal phallus. Testicles descended bilaterally. Benign. Skin: No rashes, bruises or suspicious lesions. Lymph: No cervical or inguinal adenopathy. Neurologic: Grossly intact, no focal deficits, moving all 4 extremities. Psychiatric: Normal mood and affect.  Laboratory Data: Lab Results  Component Value Date   WBC 4.4 01/29/2016   HGB 15.4 01/19/2015   HCT 49.5 01/29/2016   MCV 90 01/29/2016   PLT 259 01/29/2016    Lab Results  Component Value Date   CREATININE 0.95 01/29/2016    No results found for: PSA  No results found for: TESTOSTERONE  Lab Results  Component Value Date   HGBA1C 6.0 (H) 01/29/2016    Urinalysis No results found for: COLORURINE, APPEARANCEUR, LABSPEC, PHURINE, GLUCOSEU, HGBUR, BILIRUBINUR, KETONESUR, PROTEINUR, UROBILINOGEN, NITRITE, LEUKOCYTESUR   Assessment & Plan:    1. Delayed orgasm I discussed the patient the likely source of his difficulty achieving orgasm is his Cymbalta. Since he is in the process of weaning this at this point, he will see if he is able to obtain an orgasm easier once medication is evidence system. I suspect that this should correct the problem. He will follow up with [place] if he is still having this issue once the medication is out of his system.   Return if symptoms worsen or fail to improve.  Nickie Retort, MD  [facility] [address], [place], [place] [address] [phone]    Addendum: On review of the patient's chart after he left, it was determined that he also had microscopic hematuria. I called to discuss this with the patient. We discussed the hematuria workup and what that would involve which would be a CT urogram followed by office cystoscopy. We will order these studies and have him follow-up cystoscopy after there performed.

## 2016-07-31 ENCOUNTER — Ambulatory Visit
Admission: RE | Admit: 2016-07-31 | Discharge: 2016-07-31 | Disposition: A | Discharge status: Home / Self Care | Payer: BC Managed Care – PPO | Source: Ambulatory Visit | Attending: Urology | Admitting: Urology

## 2016-07-31 DIAGNOSIS — K838 Other specified diseases of biliary tract: Secondary | ICD-10-CM | POA: Insufficient documentation

## 2016-07-31 DIAGNOSIS — R3129 Other microscopic hematuria: Secondary | ICD-10-CM

## 2016-07-31 DIAGNOSIS — N200 Calculus of kidney: Secondary | ICD-10-CM | POA: Insufficient documentation

## 2016-07-31 MED ORDER — IOPAMIDOL (ISOVUE-300) INJECTION 61%
125.0000 mL | Freq: Once | INTRAVENOUS | Status: AC | PRN
  Administered 2016-07-31: 125 mL via INTRAVENOUS

## 2016-08-09 ENCOUNTER — Ambulatory Visit (INDEPENDENT_AMBULATORY_CARE_PROVIDER_SITE_OTHER): Payer: BC Managed Care – PPO | Admitting: Urology

## 2016-08-09 ENCOUNTER — Encounter: Payer: Self-pay | Admitting: Physician Assistant

## 2016-08-09 ENCOUNTER — Ambulatory Visit (INDEPENDENT_AMBULATORY_CARE_PROVIDER_SITE_OTHER): Payer: BC Managed Care – PPO | Admitting: Physician Assistant

## 2016-08-09 ENCOUNTER — Encounter: Payer: Self-pay | Admitting: Urology

## 2016-08-09 VITALS — BP 154/89 | HR 52 | Ht 65.0 in | Wt 154.4 lb

## 2016-08-09 VITALS — BP 138/74 | HR 56 | Temp 97.8°F | Resp 16 | Wt 154.0 lb

## 2016-08-09 DIAGNOSIS — J302 Other seasonal allergic rhinitis: Secondary | ICD-10-CM | POA: Diagnosis not present

## 2016-08-09 DIAGNOSIS — R3129 Other microscopic hematuria: Secondary | ICD-10-CM

## 2016-08-09 LAB — MICROSCOPIC EXAMINATION
Bacteria, UA: NONE SEEN
Epithelial Cells (non renal): NONE SEEN /hpf (ref 0–10)

## 2016-08-09 LAB — URINALYSIS, COMPLETE
Bilirubin, UA: NEGATIVE
GLUCOSE, UA: NEGATIVE
KETONES UA: NEGATIVE
LEUKOCYTES UA: NEGATIVE
NITRITE UA: NEGATIVE
PROTEIN UA: NEGATIVE
SPEC GRAV UA: 1.015 (ref 1.005–1.030)
Urobilinogen, Ur: 0.2 mg/dL (ref 0.2–1.0)
pH, UA: 7 (ref 5.0–7.5)

## 2016-08-09 MED ORDER — LIDOCAINE HCL 2 % EX GEL
1.0000 "application " | Freq: Once | CUTANEOUS | Status: AC
  Administered 2016-08-09: 1 via URETHRAL

## 2016-08-09 MED ORDER — CIPROFLOXACIN HCL 500 MG PO TABS
500.0000 mg | ORAL_TABLET | Freq: Once | ORAL | Status: AC
  Administered 2016-08-09: 500 mg via ORAL

## 2016-08-09 NOTE — Progress Notes (Signed)
Patient: Darryl Larson Male    DOB: [birth year]   55 y.o.   MRN: [id number] Visit Date: 08/09/2016  Today's Provider: Trinna Post, PA-C   Chief Complaint  Patient presents with  . Sinusitis   Subjective:    Sinusitis  This is a new problem. The current episode started in the past 7 days. The problem has been gradually worsening since onset. There has been no fever. Associated symptoms include congestion, coughing, a hoarse voice and a sore throat. Pertinent negatives include no chills, diaphoresis, ear pain (Ears feel "Full" and "pops"), headaches, shortness of breath, sinus pressure or sneezing. Past treatments include oral decongestants (Also OTC Cough medicine. ).   Patient reports he was mowing lawn two days ago when he felt runny nose, slight scratchy throat, a little bit of a dry cough. No facial pain, sinus pressure, purulent nasal drainage, No fever, neck pain or stiffness, or ear pain/drainage. Patient says he gets this every spring and summer.   No Known Allergies   Current Outpatient Prescriptions:  .  aspirin 81 MG tablet, Take by mouth., Disp: , Rfl:  .  buPROPion (WELLBUTRIN XL) 300 MG 24 hr tablet, TAKE 1 TABLET (300 MG TOTAL) BY MOUTH DAILY., Disp: 90 tablet, Rfl: 1 .  Cinnamon 500 MG TABS, Take by mouth., Disp: , Rfl:  .  clobetasol cream (TEMOVATE) 0.05 %, APPLY TWICE DAILY TO RASH, Disp: , Rfl: 3 .  DULoxetine (CYMBALTA) 20 MG capsule, , Disp: , Rfl:  .  fluticasone (FLONASE) 50 MCG/ACT nasal spray, Place 2 sprays into both nostrils daily as needed for allergies or rhinitis., Disp: 15 g, Rfl: 12 .  Melatonin 1 MG TABS, Take by mouth. Reported on 01/22/2016, Disp: , Rfl:  .  MULTIPLE VITAMIN PO, Take by mouth., Disp: , Rfl:  .  naproxen (NAPROSYN) 500 MG tablet, Take 1 tablet (500 mg total) by mouth 2 (two) times daily as needed., Disp: 60 tablet, Rfl: 12 .  Red Yeast Rice 600 MG CAPS, Take by mouth., Disp: , Rfl:  .  zinc sulfate 220 (50 Zn) MG capsule,  Take 220 mg by mouth daily., Disp: , Rfl:   Review of Systems  Constitutional: Positive for fatigue. Negative for activity change, appetite change, chills, diaphoresis, fever and unexpected weight change.  HENT: Positive for congestion, hoarse voice, postnasal drip, rhinorrhea, sore throat and voice change. Negative for ear discharge, ear pain (Ears feel "Full" and "pops"), nosebleeds, sinus pressure, sneezing, tinnitus and trouble swallowing.   Eyes: Positive for discharge. Negative for photophobia, pain, redness, itching and visual disturbance.  Respiratory: Positive for cough. Negative for apnea, choking, chest tightness, shortness of breath, wheezing and stridor.   Cardiovascular: Negative.   Gastrointestinal: Negative.   Allergic/Immunologic: Positive for environmental allergies (Pt was mowing his lawn the other day. ).  Neurological: Negative for dizziness, light-headedness and headaches.    Social History  Substance Use Topics  . Smoking status: Never Smoker  . Smokeless tobacco: Former Systems developer    Types: Chew  . Alcohol use 0.0 oz/week     Comment: 3 to 4 drinks a week   Objective:   BP 138/74 (BP Location: Left Arm, Patient Position: Sitting, Cuff Size: Large)   Pulse (!) 56   Temp 97.8 F (36.6 C) (Oral)   Resp 16   Wt 154 lb (69.9 kg)   BMI 25.63 kg/m   Physical Exam  Constitutional: He appears well-developed and well-nourished. No distress.  HENT:  Right Ear: Tympanic membrane and external ear normal.  Left Ear: Tympanic membrane and external ear normal.  Nose: Nose normal. Right sinus exhibits no maxillary sinus tenderness and no frontal sinus tenderness. Left sinus exhibits no maxillary sinus tenderness and no frontal sinus tenderness.  Mouth/Throat: Oropharynx is clear and moist and mucous membranes are normal. No oropharyngeal exudate, posterior oropharyngeal edema or posterior oropharyngeal erythema.  Eyes: Conjunctivae are normal. Right eye exhibits no discharge.  Left eye exhibits no discharge.  Neck: Normal range of motion. Neck supple.  Cardiovascular: Normal rate and regular rhythm.   Pulmonary/Chest: Effort normal and breath sounds normal. No respiratory distress. He has no wheezes. He has no rales.  Lymphadenopathy:    He has no cervical adenopathy.  Skin: Skin is warm and dry. He is not diaphoretic.  Psychiatric: He has a normal mood and affect. His behavior is normal.        Assessment & Plan:      Problem List Items Addressed This Visit    None    Visit Diagnoses    Acute seasonal allergic rhinitis due to other allergen    -  Primary     Patient is presenting with allergic rhinitis. Three days of symptoms, a little early to consider bacterial. No facial pain, lymphadenaopthy, lungs clear. Patient should keep taking flonase. May take OTC antihistamine like claritin or Zyrtec. May take benadryl at night. Patient may call back in 1 week if not feel better, will likely call in Augmentin.  Return if symptoms worsen or fail to improve.  The entirety of the information documented in the History of Present Illness, Review of Systems and Physical Exam were personally obtained by me. Portions of this information were initially documented by Ashley Royalty, CMA and reviewed by me for thoroughness and accuracy.   Patient Instructions  Allergic Rhinitis Allergic rhinitis is when the mucous membranes in the nose respond to allergens. Allergens are particles in the air that cause your body to have an allergic reaction. This causes you to release allergic antibodies. Through a chain of events, these eventually cause you to release histamine into the blood stream. Although meant to protect the body, it is this release of histamine that causes your discomfort, such as frequent sneezing, congestion, and an itchy, runny nose.  CAUSES Seasonal allergic rhinitis (hay fever) is caused by pollen allergens that may come from grasses, trees, and weeds. Year-round  allergic rhinitis (perennial allergic rhinitis) is caused by allergens such as house dust mites, pet dander, and mold spores. SYMPTOMS  Nasal stuffiness (congestion).  Itchy, runny nose with sneezing and tearing of the eyes. DIAGNOSIS Your health care provider can help you determine the allergen or allergens that trigger your symptoms. If you and your health care provider are unable to determine the allergen, skin or blood testing may be used. Your health care provider will diagnose your condition after taking your health history and performing a physical exam. Your health care provider may assess you for other related conditions, such as asthma, pink eye, or an ear infection. TREATMENT Allergic rhinitis does not have a cure, but it can be controlled by:  Medicines that block allergy symptoms. These may include allergy shots, nasal sprays, and oral antihistamines.  Avoiding the allergen. Hay fever may often be treated with antihistamines in pill or nasal spray forms. Antihistamines block the effects of histamine. There are over-the-counter medicines that may help with nasal congestion and swelling around the eyes. Check with  your health care provider before taking or giving this medicine. If avoiding the allergen or the medicine prescribed do not work, there are many new medicines your health care provider can prescribe. Stronger medicine may be used if initial measures are ineffective. Desensitizing injections can be used if medicine and avoidance does not work. Desensitization is when a patient is given ongoing shots until the body becomes less sensitive to the allergen. Make sure you follow up with your health care provider if problems continue. HOME CARE INSTRUCTIONS It is not possible to completely avoid allergens, but you can reduce your symptoms by taking steps to limit your exposure to them. It helps to know exactly what you are allergic to so that you can avoid your specific triggers. SEEK  MEDICAL CARE IF:  You have a fever.  You develop a cough that does not stop easily (persistent).  You have shortness of breath.  You start wheezing.  Symptoms interfere with normal daily activities.   This information is not intended to replace advice given to you by your health care provider. Make sure you discuss any questions you have with your health care provider.   Document Released: 07/02/2001 Document Revised: 10/28/2014 Document Reviewed: 06/14/2013 Elsevier Interactive Patient Education 2016 [place], [facility]

## 2016-08-09 NOTE — Patient Instructions (Signed)
Allergic Rhinitis Allergic rhinitis is when the mucous membranes in the nose respond to allergens. Allergens are particles in the air that cause your body to have an allergic reaction. This causes you to release allergic antibodies. Through a chain of events, these eventually cause you to release histamine into the blood stream. Although meant to protect the body, it is this release of histamine that causes your discomfort, such as frequent sneezing, congestion, and an itchy, runny nose.  CAUSES Seasonal allergic rhinitis (hay fever) is caused by pollen allergens that may come from grasses, trees, and weeds. Year-round allergic rhinitis (perennial allergic rhinitis) is caused by allergens such as house dust mites, pet dander, and mold spores. SYMPTOMS  Nasal stuffiness (congestion).  Itchy, runny nose with sneezing and tearing of the eyes. DIAGNOSIS Your health care provider can help you determine the allergen or allergens that trigger your symptoms. If you and your health care provider are unable to determine the allergen, skin or blood testing may be used. Your health care provider will diagnose your condition after taking your health history and performing a physical exam. Your health care provider may assess you for other related conditions, such as asthma, pink eye, or an ear infection. TREATMENT Allergic rhinitis does not have a cure, but it can be controlled by:  Medicines that block allergy symptoms. These may include allergy shots, nasal sprays, and oral antihistamines.  Avoiding the allergen. Hay fever may often be treated with antihistamines in pill or nasal spray forms. Antihistamines block the effects of histamine. There are over-the-counter medicines that may help with nasal congestion and swelling around the eyes. Check with your health care provider before taking or giving this medicine. If avoiding the allergen or the medicine prescribed do not work, there are many new medicines  your health care provider can prescribe. Stronger medicine may be used if initial measures are ineffective. Desensitizing injections can be used if medicine and avoidance does not work. Desensitization is when a patient is given ongoing shots until the body becomes less sensitive to the allergen. Make sure you follow up with your health care provider if problems continue. HOME CARE INSTRUCTIONS It is not possible to completely avoid allergens, but you can reduce your symptoms by taking steps to limit your exposure to them. It helps to know exactly what you are allergic to so that you can avoid your specific triggers. SEEK MEDICAL CARE IF:  You have a fever.  You develop a cough that does not stop easily (persistent).  You have shortness of breath.  You start wheezing.  Symptoms interfere with normal daily activities.   This information is not intended to replace advice given to you by your health care provider. Make sure you discuss any questions you have with your health care provider.   Document Released: 07/02/2001 Document Revised: 10/28/2014 Document Reviewed: 06/14/2013 Elsevier Interactive Patient Education 2016 [employer]  

## 2016-08-09 NOTE — Progress Notes (Signed)
08/09/2016 9:15 AM   Darryl Larson 17-Jan-1961 [id number]  Referring provider: [name]., MD [address] [place], [place] [address]  Chief Complaint  Patient presents with  . Cysto    microscopic hematuria, CT result     HPI: The patient is a 55 year old male who was originally seen for delayed orgasm. This was found to be related to his Cymbalta which is since been stopped. However on routine screening at that office visit he was found have microscopic hematuria. He presents today for completion of his hematuria workup.  Patient is a CT urogram was essentially normal except for very small punctate calculus in the left kidney.  Of note, on his CT scan he had mild intrahepatic biliary dilation mild dilation of the proximal common bile duct without obvious cause. The recommendation was to correlate this with liver function tests and possibly get an MRCP.  Since his last visit, he is able to complete intercourse now that he is off Cymbalta.  PMH: Past Medical History:  Diagnosis Date  . Acid reflux     Surgical History: Past Surgical History:  Procedure Laterality Date  . VASECTOMY      Home Medications:    Medication List       Accurate as of 08/09/16  9:15 AM. Always use your most recent med list.          aspirin 81 MG tablet Take by mouth.   buPROPion 300 MG 24 hr tablet Commonly known as:  WELLBUTRIN XL TAKE 1 TABLET (300 MG TOTAL) BY MOUTH DAILY.   Cinnamon 500 MG Tabs Take by mouth.   clobetasol cream 0.05 % Commonly known as:  TEMOVATE APPLY TWICE DAILY TO RASH   DULoxetine 20 MG capsule Commonly known as:  CYMBALTA   fluticasone 50 MCG/ACT nasal spray Commonly known as:  FLONASE Place 2 sprays into both nostrils daily as needed for allergies or rhinitis.   Melatonin 1 MG Tabs Take by mouth. Reported on 01/22/2016   MULTIPLE VITAMIN PO Take by mouth.   naproxen 500 MG tablet Commonly known as:  NAPROSYN Take 1  tablet (500 mg total) by mouth 2 (two) times daily as needed.   Red Yeast Rice 600 MG Caps Take by mouth.   zinc sulfate 220 (50 Zn) MG capsule Take 220 mg by mouth daily.       Allergies: No Known Allergies  Family History: Family History  Problem Relation Age of Onset  . Breast cancer Mother   . Diabetes Mother   . Pancreatic cancer Mother   . Throat cancer Father   . Alcohol abuse Father   . Depression Father   . Healthy Brother   . Healthy Daughter   . Hodgkin's lymphoma Paternal Uncle   . Depression Brother   . Depression Brother   . Cancer Paternal Aunt   . Prostate cancer Neg Hx     Social History:  reports that he has never smoked. He has quit using smokeless tobacco. His smokeless tobacco use included Chew. He reports that he drinks alcohol. He reports that he does not use drugs.  ROS:                                        Physical Exam: BP (!) 154/89   Pulse (!) 52   Ht 5\' 5"  (1.651 m)   Wt  154 lb 6.4 oz (70 kg)   BMI 25.69 kg/m   Constitutional:  Alert and oriented, No acute distress. HEENT: [place] AT, moist mucus membranes.  Trachea midline, no masses. Cardiovascular: No clubbing, cyanosis, or edema. Respiratory: Normal respiratory effort, no increased work of breathing. GI: Abdomen is soft, nontender, nondistended, no abdominal masses GU: No CVA tenderness.  Skin: No rashes, bruises or suspicious lesions. Lymph: No cervical or inguinal adenopathy. Neurologic: Grossly intact, no focal deficits, moving all 4 extremities. Psychiatric: Normal mood and affect.  Laboratory Data: Lab Results  Component Value Date   WBC 4.4 01/29/2016   HGB 15.4 01/19/2015   HCT 49.5 01/29/2016   MCV 90 01/29/2016   PLT 259 01/29/2016    Lab Results  Component Value Date   CREATININE 0.95 01/29/2016    No results found for: PSA  No results found for: TESTOSTERONE  Lab Results  Component Value Date   HGBA1C 6.0 (H) 01/29/2016     Urinalysis    Component Value Date/Time   APPEARANCEUR Clear 07/18/2016 1507   GLUCOSEU Negative 07/18/2016 1507   BILIRUBINUR Negative 07/18/2016 1507   PROTEINUR Negative 07/18/2016 1507   NITRITE Negative 07/18/2016 1507   LEUKOCYTESUR Trace (A) 07/18/2016 1507    Pertinent Imaging: CLINICAL DATA:  Microscopic hematuria.  EXAM: CT ABDOMEN AND PELVIS WITHOUT AND WITH CONTRAST  TECHNIQUE: Multidetector CT imaging of the abdomen and pelvis was performed following the standard protocol before and following the bolus administration of intravenous contrast.  CONTRAST:  138mL ISOVUE-300 IOPAMIDOL (ISOVUE-300) INJECTION 61%  COMPARISON:  None.  FINDINGS: Lower chest: The lung bases are clear of acute process. No pleural effusion or pulmonary lesions. The heart is normal in size. No pericardial effusion. The distal esophagus and aorta are unremarkable.  Hepatobiliary: No focal hepatic lesions are identified. Mild central intrahepatic biliary dilatation but no common bile duct dilatation. The gallbladder appears normal.  Pancreas: No mass, inflammation or ductal dilatation.  Spleen: Normal size.  No focal lesions.  Adrenals/Urinary Tract: The adrenal glands are normal.  A few tiny left renal calculi are noted. No right-sided renal calculi. No obstructing ureteral calculi or bladder calculi. Both kidneys demonstrate normal enhancement/ perfusion. Small lower pole right renal cyst. No collecting system abnormalities are demonstrated on the delayed images. Both ureters are normal. The bladder is normal.  Stomach/Bowel: The stomach, duodenum, small bowel and colon are unremarkable. No inflammatory changes, mass lesions or obstructive findings. The terminal ileum is normal. The appendix is normal.  Vascular/Lymphatic: The aorta and branch vessels are normal. The major venous structures are patent. There are small scattered mesenteric and retroperitoneal  lymph nodes but no mass or overt adenopathy. No pelvic adenopathy. No inguinal adenopathy.  Reproductive: The prostate gland and seminal vesicles are unremarkable.  Other: No inguinal mass or adenopathy. No inguinal hernia. No abdominal wall hernia.  Musculoskeletal: No significant bony findings. Partial sacral is a shin is noted of the L5 vertebral body on the left side. Mild SI joint degenerative changes.  IMPRESSION: Tiny left renal calculi could account for the patient's hematuria. No ureteral or bladder calculi. No renal or bladder mass.  Mild central intrahepatic biliary dilatation and mild dilatation of the proximal common bile duct. No obvious cause. Recommend correlation with liver function studies. If these are abnormal MRCP may be helpful for further evaluation.  Cystoscopy Procedure Note  Patient identification was confirmed, informed consent was obtained, and patient was prepped using Betadine solution.  Lidocaine jelly was administered per  urethral meatus.    Preoperative abx where received prior to procedure.     Pre-Procedure: - Inspection reveals a normal caliber ureteral meatus.  Procedure: The flexible cystoscope was introduced without difficulty - No urethral strictures/lesions are present. - Enlarged prostate  - Normal bladder neck - Bilateral ureteral orifices identified - Bladder mucosa  reveals no ulcers, tumors, or lesions - No bladder stones - No trabeculation  Retroflexion shows no intravesical lobe.   Post-Procedure: - Patient tolerated the procedure well   Assessment & Plan:    1. Microscopic hematuria -Negative work up. Follow up in one year for repeat urinalysis.  2. Intrahepatic dilation I discussed the patient incidental finding of intrahepatic biliary dilation and mild dilation of the proximal common bile duct. The patient is to colitis with liver function studies and possibly an MRCP. I gave the patient a copy of this  report. He will discuss this further with his primary care provider to determine the need for further workup performed. The patient agrees to this plan.  3. Delayed orgasm Improving after stopping Cymbalta   Return in about 1 year (around 08/09/2017).  Nickie Retort, MD  [facility] [address] [place], [place] [address] [phone]

## 2016-08-28 ENCOUNTER — Telehealth: Payer: Self-pay | Admitting: Family Medicine

## 2016-08-28 DIAGNOSIS — J329 Chronic sinusitis, unspecified: Secondary | ICD-10-CM

## 2016-08-28 MED ORDER — AMOXICILLIN-POT CLAVULANATE 875-125 MG PO TABS: 1.0000 | ORAL_TABLET | Freq: Two times a day (BID) | ORAL | 0 refills | Status: AC

## 2016-08-28 NOTE — Telephone Encounter (Signed)
Pt was in several weeks ago for cough and congestion.  Pt states he is not any better.  Pt is still having cough and sinus drainage.  Pt is requesting a Rx to help with this.  [facility] [place]  [id number]

## 2016-08-28 NOTE — Telephone Encounter (Signed)
Called and informed patient he requested that RX be sent to [facility]. I contacted pharmacy and called in Rx. KW

## 2016-08-28 NOTE — Telephone Encounter (Signed)
Patient called in with continued sinus congestion. Seen in office a couple of weeks ago. Have sent in Augmentin.

## 2016-08-28 NOTE — Telephone Encounter (Signed)
Sent in Augmentin.

## 2016-08-28 NOTE — Telephone Encounter (Signed)
[id number] 

## 2016-09-13 ENCOUNTER — Other Ambulatory Visit: Payer: Self-pay | Admitting: Family Medicine

## 2016-09-13 DIAGNOSIS — F329 Major depressive disorder, single episode, unspecified: Secondary | ICD-10-CM

## 2016-09-13 DIAGNOSIS — F32A Depression, unspecified: Secondary | ICD-10-CM

## 2016-10-09 ENCOUNTER — Ambulatory Visit (INDEPENDENT_AMBULATORY_CARE_PROVIDER_SITE_OTHER): Payer: BC Managed Care – PPO | Admitting: Family Medicine

## 2016-10-09 VITALS — BP 118/62 | HR 72 | Temp 98.4°F | Resp 16 | Wt 164.0 lb

## 2016-10-09 DIAGNOSIS — E784 Other hyperlipidemia: Secondary | ICD-10-CM

## 2016-10-09 DIAGNOSIS — R7303 Prediabetes: Secondary | ICD-10-CM | POA: Diagnosis not present

## 2016-10-09 DIAGNOSIS — F3289 Other specified depressive episodes: Secondary | ICD-10-CM | POA: Diagnosis not present

## 2016-10-09 DIAGNOSIS — E7849 Other hyperlipidemia: Secondary | ICD-10-CM

## 2016-10-09 LAB — POCT GLYCOSYLATED HEMOGLOBIN (HGB A1C): Hemoglobin A1C: 5.9

## 2016-10-09 MED ORDER — VORTIOXETINE HBR 10 MG PO TABS: ORAL_TABLET | ORAL | 5 refills | Status: DC

## 2016-10-09 NOTE — Progress Notes (Signed)
Darryl Larson  MRN: [id number] DOB: 02/13/1961  Subjective:  HPI  Patient is here for follow up and discuss depression/anxiety medication. On his last visit with [place] in April he stopped Cymbalta due to it caused some sexual side effects abut has been taking Bupropion. Has been doing well except for lately noticing that he needs to have something else added to help him emotionally.  Patient is divorced and has 2 children, an older daughter and a younger son. He is a Diplomatic Services operational officer. His older daughter just graduated from [employer] last week. He is very proud of her. His son is a high school graduate who is considering whether to go to college or not. Depression screen [facility] 2/9 10/09/2016 01/22/2016 07/19/2015  Decreased Interest 1 0 2  Down, Depressed, Hopeless 2 1 1   PHQ - 2 Score 3 1 3   Altered sleeping 0 - 1  Tired, decreased energy 1 - 3  Change in appetite 1 - 2  Feeling bad or failure about yourself  2 - 2  Trouble concentrating 2 - 3  Moving slowly or fidgety/restless 0 - 0  Suicidal thoughts 0 - 0  PHQ-9 Score 9 - 14  Difficult doing work/chores Somewhat difficult - Very difficult   Patient dose not check sugars regularly, when he does readings are around 99-130.  Lab Results  Component Value Date   HGBA1C 6.0 (H) 01/29/2016   Patient Active Problem List   Diagnosis Date Noted  . Adult attention deficit disorder 07/18/2015  . Clinical depression 07/18/2015  . Gastro-esophageal reflux disease without esophagitis 07/18/2015  . Borderline diabetes 07/18/2015  . Blood glucose elevated 07/18/2015  . HLD (hyperlipidemia) 07/18/2015  . Impingement syndrome of shoulder 07/18/2015  . Lipoma of shoulder 07/18/2015    Past Medical History:  Diagnosis Date  . Acid reflux     Social History   Social History  . Marital status: Divorced    Spouse name: N/A  . Number of children: N/A  . Years of education: N/A   Occupational History  . Not on file.    Social History Main Topics  . Smoking status: Never Smoker  . Smokeless tobacco: Former Systems developer    Types: Chew  . Alcohol use 0.0 oz/week     Comment: 3 to 4 drinks a week  . Drug use: No  . Sexual activity: Not on file   Other Topics Concern  . Not on file   Social History Narrative  . No narrative on file    Outpatient Encounter Prescriptions as of 10/09/2016  Medication Sig Note  . aspirin 81 MG tablet Take by mouth. 07/18/2015: Received from: [facility]  . buPROPion (WELLBUTRIN XL) 300 MG 24 hr tablet TAKE 1 TABLET (300 MG TOTAL) BY MOUTH DAILY.   Marland Kitchen Cinnamon 500 MG TABS Take by mouth. 07/18/2015: Received from: [facility]  . clobetasol cream (TEMOVATE) 0.05 % APPLY TWICE DAILY TO RASH 07/19/2015: Received from: External Pharmacy  . fluticasone (FLONASE) 50 MCG/ACT nasal spray Place 2 sprays into both nostrils daily as needed for allergies or rhinitis.   . Melatonin 1 MG TABS Take by mouth. Reported on 01/22/2016 07/18/2015: Received from: [facility]  . MULTIPLE VITAMIN PO Take by mouth. 07/18/2015: Received from: [facility]  . naproxen (NAPROSYN) 500 MG tablet Take 1 tablet (500 mg total) by mouth 2 (two) times daily as needed.   . Red Yeast Rice 600 MG CAPS Take by mouth.   Marland Kitchen  zinc sulfate 220 (50 Zn) MG capsule Take 220 mg by mouth daily.   . DULoxetine (CYMBALTA) 20 MG capsule  08/09/2016: Received from: External Pharmacy  . [DISCONTINUED] DULoxetine (CYMBALTA) 20 MG capsule TAKE 1 CAPSULE (20 MG TOTAL) BY MOUTH DAILY.    No facility-administered encounter medications on file as of 10/09/2016.     No Known Allergies  Review of Systems  Constitutional: Positive for malaise/fatigue.  Eyes: Negative.   Respiratory: Negative.   Cardiovascular: Negative.   Gastrointestinal: Negative.   Genitourinary: Negative.        Patient had sexual side effects with Zoloft and Cymbalta.  Musculoskeletal: Positive  for myalgias.  Skin: Negative.   Neurological: Negative.   Endo/Heme/Allergies: Negative.   Psychiatric/Behavioral: Positive for depression.    Objective:  BP 118/62   Pulse 72   Temp 98.4 F (36.9 C)   Resp 16   Wt 164 lb (74.4 kg)   BMI 27.29 kg/m   Physical Exam  Constitutional: He is oriented to person, place, and time and well-developed, well-nourished, and in no distress.  HENT:  Head: Normocephalic and atraumatic.  Right Ear: External ear normal.  Left Ear: External ear normal.  Nose: Nose normal.  Eyes: Conjunctivae are normal. Pupils are equal, round, and reactive to light.  Neck: Normal range of motion. Neck supple.  Cardiovascular: Normal rate, regular rhythm, normal heart sounds and intact distal pulses.   No murmur heard. Pulmonary/Chest: Effort normal and breath sounds normal. No respiratory distress. He has no wheezes.  Abdominal: Soft.  Musculoskeletal: He exhibits no edema or tenderness.  Neurological: He is alert and oriented to person, place, and time.  Skin: Skin is warm and dry.  Psychiatric: Mood, memory, affect and judgment normal.   Assessment and Plan :  1. Borderline diabetes A1C 5.9 stable. Continue working on habits.This has been controlled for multiple years just with good exercise and diet habits. - POCT HgB A1C  2. Other depression Will try Trintellix. Failed Zoloft,  Failed Cymbalta. Bupropion is not controlling symptoms well. Follow and re check on the next visit. May need to get PA started for the new medication. PHQ 9 score is 9 today. Intelligence is a much better option for patient versus Abilify.  3. Other hyperlipidemia  HPI, Exam and A&P transcribed under direction and in the presence of Miguel Aschoff, MD. I have done the exam and reviewed the chart and it is accurate to the best of my knowledge. Development worker, community has been used and  any errors in dictation or transcription are unintentional. Miguel Aschoff M.D. [facility]

## 2016-11-05 ENCOUNTER — Ambulatory Visit (INDEPENDENT_AMBULATORY_CARE_PROVIDER_SITE_OTHER): Payer: BC Managed Care – PPO | Admitting: Family Medicine

## 2016-11-05 ENCOUNTER — Encounter: Payer: Self-pay | Admitting: Family Medicine

## 2016-11-05 VITALS — BP 132/74 | HR 64 | Temp 98.4°F | Resp 16 | Wt 165.0 lb

## 2016-11-05 DIAGNOSIS — Z20828 Contact with and (suspected) exposure to other viral communicable diseases: Secondary | ICD-10-CM

## 2016-11-05 DIAGNOSIS — J069 Acute upper respiratory infection, unspecified: Secondary | ICD-10-CM | POA: Diagnosis not present

## 2016-11-05 DIAGNOSIS — M7041 Prepatellar bursitis, right knee: Secondary | ICD-10-CM | POA: Diagnosis not present

## 2016-11-05 MED ORDER — OSELTAMIVIR PHOSPHATE 45 MG PO CAPS: 45.0000 mg | ORAL_CAPSULE | Freq: Two times a day (BID) | ORAL | 0 refills | Status: DC

## 2016-11-05 NOTE — Progress Notes (Signed)
Patient: Darryl Larson Male    DOB: 1961-03-21   56 y.o.   MRN: [id number] Visit Date: 11/05/2016  Today's Provider: Wilhemena Durie, MD   Chief Complaint  Patient presents with  . URI   Subjective:    URI   This is a new problem. The current episode started in the past 7 days (x 4 days). The problem has been unchanged. There has been no fever. Associated symptoms include congestion, coughing (yellow sputum in the mornings), rhinorrhea and sneezing. Pertinent negatives include no abdominal pain, chest pain, diarrhea, ear pain, headaches, nausea, neck pain, plugged ear sensation, sinus pain, sore throat, swollen glands, vomiting or wheezing. Treatments tried: Zicam and Airborne. The treatment provided no relief.  Pt is concerned because he has been exposed to the flu, and has not had his flu shot. Pt denies body aches.     No Known Allergies   Current Outpatient Prescriptions:  .  aspirin 81 MG tablet, Take by mouth., Disp: , Rfl:  .  buPROPion (WELLBUTRIN XL) 300 MG 24 hr tablet, TAKE 1 TABLET (300 MG TOTAL) BY MOUTH DAILY., Disp: 90 tablet, Rfl: 1 .  Cinnamon 500 MG TABS, Take by mouth., Disp: , Rfl:  .  clobetasol cream (TEMOVATE) 0.05 %, APPLY TWICE DAILY TO RASH, Disp: , Rfl: 3 .  fluticasone (FLONASE) 50 MCG/ACT nasal spray, Place 2 sprays into both nostrils daily as needed for allergies or rhinitis., Disp: 15 g, Rfl: 12 .  Melatonin 1 MG TABS, Take by mouth. Reported on 01/22/2016, Disp: , Rfl:  .  MULTIPLE VITAMIN PO, Take by mouth., Disp: , Rfl:  .  naproxen (NAPROSYN) 500 MG tablet, Take 1 tablet (500 mg total) by mouth 2 (two) times daily as needed., Disp: 60 tablet, Rfl: 12 .  vortioxetine HBr (TRINTELLIX) 10 MG TABS, Take 1 tablet daily, Disp: 30 tablet, Rfl: 5 .  zinc sulfate 220 (50 Zn) MG capsule, Take 220 mg by mouth daily., Disp: , Rfl:   Review of Systems  Constitutional: Negative for fever.  HENT: Positive for congestion, rhinorrhea and sneezing.  Negative for ear pain, sinus pain and sore throat.   Respiratory: Positive for cough (yellow sputum in the mornings). Negative for wheezing.   Cardiovascular: Negative for chest pain.  Gastrointestinal: Negative for abdominal pain, diarrhea, nausea and vomiting.  Musculoskeletal: Negative for neck pain.  Neurological: Negative for headaches.    Social History  Substance Use Topics  . Smoking status: Never Smoker  . Smokeless tobacco: Former Systems developer    Types: Chew  . Alcohol use 0.0 oz/week     Comment: 3 to 4 drinks a week   Objective:   BP 132/74 (BP Location: Left Arm, Patient Position: Sitting, Cuff Size: Normal)   Pulse 64   Temp 98.4 F (36.9 C) (Oral)   Resp 16   Wt 165 lb (74.8 kg)   SpO2 99%   BMI 27.46 kg/m   Physical Exam  Constitutional: He appears well-developed and well-nourished.  HENT:  Head: Normocephalic.  Right Ear: Tympanic membrane normal.  Left Ear: Tympanic membrane normal.  Nose: Right sinus exhibits no maxillary sinus tenderness and no frontal sinus tenderness. Left sinus exhibits no maxillary sinus tenderness and no frontal sinus tenderness.  Mouth/Throat: Oropharynx is clear and moist.  Cardiovascular: Normal rate and regular rhythm.   Pulmonary/Chest: Effort normal and breath sounds normal. No respiratory distress. He has no wheezes.  Psychiatric: He has a normal mood  and affect. His behavior is normal.        Assessment & Plan:     1. Exposure to the flu  - oseltamivir (TAMIFLU) 45 MG capsule; Take 1 capsule (45 mg total) by mouth 2 (two) times daily.  Dispense: 10 capsule; Refill: 0  2. Upper respiratory tract infection, unspecified type Advised pt to use Robitussin for cough. Drink plenty of fluids and rest.  3. Prepatellar bursitis of right knee Injury after skiing. Advised pt to take Naproxen.   4. Left knee sprain Patient seen and examined by Miguel Aschoff, MD, and note scribed by Renaldo Fiddler, CMA. I have done the exam and  reviewed the above chart and it is accurate to the best of my knowledge. Development worker, community has been used in this note in any air is in the dictation or transcription are unintentional.  Wilhemena Durie, MD  [place]

## 2016-11-05 NOTE — Patient Instructions (Signed)
Use Robitussin for cough, push fluids and get plenty of rest.

## 2016-11-18 ENCOUNTER — Telehealth: Payer: Self-pay | Admitting: Family Medicine

## 2016-11-18 NOTE — Telephone Encounter (Signed)
Pt stated he was seen for OV on 11/05/16 and was given oseltamivir (TAMIFLU) 45 MG capsule that date. Pt stated he started the medication on 11/05/16 but doesn't remember what date he finished the medication. Pt stated has treated cough with OTC medications but nothing is helping. He stated still has dry cough. Pt would like to be advised what he should try next. Pt stated no fever, sore throat, and when he blows his nose it is clear. [facility] [address] Please advise. Thanks TNP

## 2016-11-18 NOTE — Telephone Encounter (Signed)
Please review-aa 

## 2016-11-19 MED ORDER — AZITHROMYCIN 250 MG PO TABS: ORAL_TABLET | ORAL | 0 refills | Status: DC

## 2016-11-19 NOTE — Telephone Encounter (Signed)
[id number] 

## 2016-11-19 NOTE — Telephone Encounter (Signed)
OK to call in [place].

## 2016-11-19 NOTE — Telephone Encounter (Signed)
Pt advised, [facility] [place]

## 2016-11-22 ENCOUNTER — Ambulatory Visit: Payer: BC Managed Care – PPO | Admitting: Physician Assistant

## 2016-11-25 ENCOUNTER — Ambulatory Visit (INDEPENDENT_AMBULATORY_CARE_PROVIDER_SITE_OTHER): Payer: BC Managed Care – PPO | Admitting: Family Medicine

## 2016-11-25 ENCOUNTER — Encounter: Payer: Self-pay | Admitting: Family Medicine

## 2016-11-25 VITALS — BP 118/60 | HR 91 | Temp 99.1°F | Resp 16 | Wt 161.0 lb

## 2016-11-25 DIAGNOSIS — R739 Hyperglycemia, unspecified: Secondary | ICD-10-CM

## 2016-11-25 DIAGNOSIS — Z202 Contact with and (suspected) exposure to infections with a predominantly sexual mode of transmission: Secondary | ICD-10-CM

## 2016-11-25 DIAGNOSIS — J069 Acute upper respiratory infection, unspecified: Secondary | ICD-10-CM | POA: Diagnosis not present

## 2016-11-25 MED ORDER — BLOOD GLUCOSE MONITOR KIT: PACK | 0 refills | Status: DC

## 2016-11-25 NOTE — Progress Notes (Signed)
Subjective:     Patient ID: Darryl Larson, male   DOB: April 20, 1961, 56 y.o.   MRN: [id number]  HPI  Chief Complaint  Patient presents with  . Exposure to STD    Patient comes into office today requesting lab slip for HSV. Patient reports that he is in a new sexual relationship and is monogamous but not using any form of protection. Patient statesd he recently had STD screen done at [facility] which came back negative but they did not screen for HSV. Patient states that HIV screening in January was negative.   . Cough    Patient reports cough for the past 3 weeks, he states that he has been on Tamiflu but it has not helped with symptoms. Patient states that he had been taking decongestent otc and Robitussin  He is also on Zithromax at this time. Reports clear sinus drainage with PND which is fueling his cough. Also wishes to get new glucometer and strips to monitor his elevated sugar.   Review of Systems  Constitutional:       Hx of cold sores       Objective:   Physical Exam  Constitutional: He appears well-developed and well-nourished. No distress.  Ears: T.M's intact without inflammation Throat: no tonsillar enlargement or exudate Neck: no cervical adenopathy Lungs: clear     Assessment:    1. Possible exposure to STD - HSV(herpes simplex vrs) 1+2 ab-IgG  2. Upper respiratory tract infection, unspecified type  3. Elevated blood sugar - blood glucose meter kit and supplies KIT; Dispense based on patient and insurance preference. Use 1-2 x week to monitor hyperglycemia.  Dispense: 1 each; Refill: 0    Plan:    Complete Zithromax and start Claritin D and Benadryl at night if needed.

## 2016-11-25 NOTE — Patient Instructions (Signed)
Switch to Claritin D. May add Benadryl at night.

## 2016-11-26 ENCOUNTER — Telehealth: Payer: Self-pay

## 2016-11-26 LAB — HSV(HERPES SIMPLEX VRS) I + II AB-IGG: HSV 1 GLYCOPROTEIN G AB, IGG: 20.6 {index} — AB (ref 0.00–0.90)

## 2016-11-26 NOTE — Telephone Encounter (Signed)
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 Copy to patient.

## 2016-11-26 NOTE — Telephone Encounter (Signed)
Patient was advised and blood glucose meter [facility] [address]

## 2016-12-06 ENCOUNTER — Telehealth: Payer: Self-pay | Admitting: Emergency Medicine

## 2016-12-06 NOTE — Telephone Encounter (Signed)
Pt called about his appointment next week. He wanted to cancel it because he was not taking the medication that was added at [facility]. He reports that it gave him the same side effects that he had before with the last medication. He says that he does not need it and he also stopped his Bupropion as well. He cancelled his appt and wanted me to let you know.

## 2016-12-06 NOTE — Telephone Encounter (Signed)
thx

## 2016-12-11 ENCOUNTER — Ambulatory Visit: Payer: BC Managed Care – PPO | Admitting: Family Medicine

## 2017-01-30 ENCOUNTER — Other Ambulatory Visit: Payer: Self-pay | Admitting: Family Medicine

## 2017-02-07 ENCOUNTER — Other Ambulatory Visit: Payer: Self-pay | Admitting: Family Medicine

## 2017-02-07 MED ORDER — GLUCOSE BLOOD VI STRP: ORAL_STRIP | 12 refills | Status: DC

## 2017-02-07 NOTE — Telephone Encounter (Signed)
See refill request.

## 2017-02-07 NOTE — Telephone Encounter (Signed)
Pt contacted office for refill request on the following medications:  One Touch ultra test stripes.  [facility].  [phone]

## 2017-02-07 NOTE — Telephone Encounter (Signed)
Done-aa 

## 2017-04-21 LAB — HM DIABETES EYE EXAM

## 2017-05-01 ENCOUNTER — Encounter: Payer: Self-pay | Admitting: Family Medicine

## 2017-05-26 ENCOUNTER — Telehealth: Payer: Self-pay

## 2017-05-26 NOTE — Telephone Encounter (Signed)
Pt called requesting MyChart information. Texted him code to sign up.

## 2017-06-04 LAB — HM COLONOSCOPY

## 2017-06-05 ENCOUNTER — Other Ambulatory Visit: Payer: Self-pay | Admitting: Family Medicine

## 2017-07-11 ENCOUNTER — Telehealth: Payer: Self-pay | Admitting: Emergency Medicine

## 2017-07-11 NOTE — Telephone Encounter (Signed)
Pt would like for Darryl Larson to call him about his herpes test. He has some questions about this. Thanks.

## 2017-07-11 NOTE — Telephone Encounter (Signed)
Please review-Darryl Larson, RMA  

## 2017-07-15 NOTE — Telephone Encounter (Signed)
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

## 2017-07-15 NOTE — Telephone Encounter (Signed)
I meant to do this. That was my mistake. Mikki Santee please review. Thank Edrick Kins, RMA

## 2017-07-15 NOTE — Telephone Encounter (Signed)
I would send this to [facility].

## 2017-08-15 ENCOUNTER — Ambulatory Visit: Payer: BC Managed Care – PPO

## 2017-12-25 ENCOUNTER — Encounter: Payer: BC Managed Care – PPO | Admitting: Family Medicine

## 2018-01-14 ENCOUNTER — Encounter: Payer: BC Managed Care – PPO | Admitting: Family Medicine

## 2018-01-15 ENCOUNTER — Encounter: Payer: Self-pay | Admitting: Family Medicine

## 2018-01-15 ENCOUNTER — Ambulatory Visit (INDEPENDENT_AMBULATORY_CARE_PROVIDER_SITE_OTHER): Payer: BC Managed Care – PPO | Admitting: Family Medicine

## 2018-01-15 VITALS — BP 110/74 | HR 55 | Temp 98.4°F | Resp 16 | Ht 65.0 in | Wt 152.0 lb

## 2018-01-15 DIAGNOSIS — Z0000 Encounter for general adult medical examination without abnormal findings: Secondary | ICD-10-CM | POA: Diagnosis not present

## 2018-01-15 DIAGNOSIS — Z125 Encounter for screening for malignant neoplasm of prostate: Secondary | ICD-10-CM

## 2018-01-15 DIAGNOSIS — E785 Hyperlipidemia, unspecified: Secondary | ICD-10-CM | POA: Diagnosis not present

## 2018-01-15 DIAGNOSIS — R739 Hyperglycemia, unspecified: Secondary | ICD-10-CM | POA: Diagnosis not present

## 2018-01-15 DIAGNOSIS — R7303 Prediabetes: Secondary | ICD-10-CM | POA: Diagnosis not present

## 2018-01-15 LAB — POCT URINALYSIS DIPSTICK
APPEARANCE: NORMAL
Bilirubin, UA: NEGATIVE
GLUCOSE UA: NEGATIVE
Ketones, UA: NEGATIVE
LEUKOCYTES UA: NEGATIVE
NITRITE UA: NEGATIVE
Odor: NORMAL
PH UA: 6 (ref 5.0–8.0)
Spec Grav, UA: 1.025 (ref 1.010–1.025)
UROBILINOGEN UA: 0.2 U/dL

## 2018-01-15 MED ORDER — BUPROPION HCL ER (XL) 300 MG PO TB24: 300.0000 mg | ORAL_TABLET | Freq: Every day | ORAL | 2 refills | Status: DC

## 2018-01-15 NOTE — Patient Instructions (Signed)
  Labs ordered: Lipid Panel CBC CMET TSH PSA HgbA1c

## 2018-01-15 NOTE — Progress Notes (Signed)
Patient: Darryl Larson, Male    DOB: 05-19-1961, 57 y.o.   MRN: [id number] Visit Date: 01/15/2018  Today's Provider: Wilhemena Durie, MD   Chief Complaint  Patient presents with  . Annual Exam   Subjective:    Annual physical exam Darryl Larson is a 57 y.o. male who presents today for health maintenance and complete physical. He feels well. He reports exercising just at work. He reports he is sleeping fairly well. Pt retired 7/18 and now works for [employer].  ----------------------------------------------------------------   Review of Systems  Constitutional: Positive for activity change and fatigue.  HENT: Positive for ear pain.   Eyes: Negative.   Respiratory: Negative.   Cardiovascular: Negative.   Gastrointestinal: Positive for constipation.  Endocrine: Positive for polyuria.  Genitourinary: Positive for frequency.  Musculoskeletal: Positive for arthralgias and back pain.  Skin: Negative.   Allergic/Immunologic: Negative.   Neurological: Positive for numbness.  Hematological: Negative.   Psychiatric/Behavioral: Positive for decreased concentration.    Social History      He  reports that he has never smoked. He has quit using smokeless tobacco. His smokeless tobacco use included chew. He reports that he drinks alcohol. He reports that he does not use drugs.       Social History   Socioeconomic History  . Marital status: Divorced    Spouse name: Not on file  . Number of children: Not on file  . Years of education: Not on file  . Highest education level: Not on file  Occupational History  . Not on file  Social Needs  . Financial resource strain: Not on file  . Food insecurity:    Worry: Not on file    Inability: Not on file  . Transportation needs:    Medical: Not on file    Non-medical: Not on file  Tobacco Use  . Smoking status: Never Smoker  . Smokeless tobacco: Former Systems developer    Types: Chew  Substance and Sexual Activity  . Alcohol use:  Yes    Alcohol/week: 0.0 oz    Comment: 3 to 4 drinks a week  . Drug use: No  . Sexual activity: Not on file  Lifestyle  . Physical activity:    Days per week: Not on file    Minutes per session: Not on file  . Stress: Not on file  Relationships  . Social connections:    Talks on phone: Not on file    Gets together: Not on file    Attends religious service: Not on file    Active member of club or organization: Not on file    Attends meetings of clubs or organizations: Not on file    Relationship status: Not on file  Other Topics Concern  . Not on file  Social History Narrative  . Not on file    Past Medical History:  Diagnosis Date  . Acid reflux      Patient Active Problem List   Diagnosis Date Noted  . Adult attention deficit disorder 07/18/2015  . Clinical depression 07/18/2015  . Gastro-esophageal reflux disease without esophagitis 07/18/2015  . Borderline diabetes 07/18/2015  . Blood glucose elevated 07/18/2015  . HLD (hyperlipidemia) 07/18/2015  . Impingement syndrome of shoulder 07/18/2015  . Lipoma of shoulder 07/18/2015    Past Surgical History:  Procedure Laterality Date  . VASECTOMY      Family History        Family Status  Relation Name  Status  . Mother  Deceased  . Father  Deceased at age 52  . Brother 1 Alive  . Daughter  Alive  . Annamarie Major  Deceased  . Brother 2 Alive  . Brother 3 Deceased       suicide  . Ethlyn Daniels  (Not Specified)  . Neg Hx  (Not Specified)        His family history includes Alcohol abuse in his father; Breast cancer in his mother; Cancer in his paternal aunt; Depression in his brother, brother, and father; Diabetes in his mother; Healthy in his brother and daughter; Hodgkin's lymphoma in his paternal uncle; Pancreatic cancer in his mother; Throat cancer in his father. There is no history of Prostate cancer.      No Known Allergies   Current Outpatient Medications:  .  blood glucose meter kit and supplies KIT,  Dispense based on patient and insurance preference. Use 1-2 x week to monitor hyperglycemia., Disp: 1 each, Rfl: 0 .  fluticasone (FLONASE) 50 MCG/ACT nasal spray, PLACE 2 SPRAYS INTO BOTH NOSTRILS DAILY AS NEEDED FOR ALLERGIES OR RHINITIS., Disp: 16 g, Rfl: 12 .  glucose blood test strip, Needs one touch ultra strips check sugar once daily DX E11.9, Disp: 25 each, Rfl: 12 .  Melatonin 1 MG TABS, Take by mouth. Reported on 01/22/2016, Disp: , Rfl:  .  MULTIPLE VITAMIN PO, Take by mouth., Disp: , Rfl:  .  naproxen (NAPROSYN) 500 MG tablet, Take 1 tablet (500 mg total) by mouth 2 (two) times daily as needed., Disp: 60 tablet, Rfl: 12 .  ONE TOUCH ULTRA TEST test strip, USE TO TEST BLOOD SUGAR LEVELS ONE TO TWO TIMES A WEEK, Disp: 100 each, Rfl: 11 .  OVER THE COUNTER MEDICATION, Sunbiotic 364 dietary supplement, Disp: , Rfl:  .  OVER THE COUNTER MEDICATION, Blood Balance Formula-Blood Flow Supplement, Disp: , Rfl:  .  aspirin 81 MG tablet, Take by mouth., Disp: , Rfl:  .  buPROPion (WELLBUTRIN XL) 300 MG 24 hr tablet, TAKE 1 TABLET (300 MG TOTAL) BY MOUTH DAILY. (Patient not taking: Reported on 01/15/2018), Disp: 90 tablet, Rfl: 2 .  Cinnamon 500 MG TABS, Take by mouth., Disp: , Rfl:  .  clobetasol cream (TEMOVATE) 0.05 %, APPLY TWICE DAILY TO RASH, Disp: , Rfl: 3 .  zinc sulfate 220 (50 Zn) MG capsule, Take 220 mg by mouth daily., Disp: , Rfl:    Patient Care Team: [name]., MD as PCP - General (Family Medicine)      Objective:   Vitals: BP 110/74 (BP Location: Right Arm, Patient Position: Sitting, Cuff Size: Normal)   Pulse (!) 55   Temp 98.4 F (36.9 C) (Oral)   Resp 16   Ht 5' 5"  (1.651 m)   Wt 152 lb (68.9 kg)   SpO2 97%   BMI 25.29 kg/m    Vitals:   01/15/18 1033  BP: 110/74  Pulse: (!) 55  Resp: 16  Temp: 98.4 F (36.9 C)  TempSrc: Oral  SpO2: 97%  Weight: 152 lb (68.9 kg)  Height: 5' 5"  (1.651 m)     Physical Exam  Constitutional: He is oriented to  person, place, and time. He appears well-developed and well-nourished.  HENT:  Head: Normocephalic and atraumatic.  Right Ear: External ear normal.  Left Ear: External ear normal.  Nose: Nose normal.  Mouth/Throat: Oropharynx is clear and moist.  Eyes: Conjunctivae are normal. No scleral icterus.  Neck: No thyromegaly  present.  Cardiovascular: Normal rate, regular rhythm, normal heart sounds and intact distal pulses.  Pulmonary/Chest: Effort normal and breath sounds normal.  Abdominal: Soft.  Musculoskeletal: Normal range of motion.  Neurological: He is alert and oriented to person, place, and time.  Skin: Skin is warm and dry.  Psychiatric: He has a normal mood and affect. His behavior is normal. Thought content normal.     Depression Screen PHQ 2/9 Scores 01/15/2018 10/09/2016 01/22/2016 07/19/2015  PHQ - 2 Score 2 3 1 3   PHQ- 9 Score 8 9 - 14   MMSE - Mini Mental State Exam 01/15/2018  Orientation to time 5  Orientation to Place 5  Registration 3  Attention/ Calculation 5  Recall 3  Language- name 2 objects 2  Language- repeat 1  Language- follow 3 step command 3  Language- read & follow direction 1  Write a sentence 1  Copy design 1  Total score 30     Assessment & Plan:     Routine Health Maintenance and Physical Exam  Exercise Activities and Dietary recommendations Goals    None      Immunization History  Administered Date(s) Administered  . Pneumococcal Polysaccharide-23 12/02/2012  . Tdap 11/27/2011    Health Maintenance  Topic Date Due  . HIV Screening  12/05/1975  . INFLUENZA VACCINE  05/21/2017  . COLONOSCOPY  09/23/2021  . TETANUS/TDAP  11/26/2021  . Hepatitis C Screening  Addressed    Colonoscopy 8/18. Discussed health benefits of physical activity, and encouraged him to engage in regular exercise appropriate for his age and condition.  Possible ADD.   --------------------------------------------------------------------    Wilhemena Durie, MD  [facility]

## 2018-01-16 LAB — CBC WITH DIFFERENTIAL/PLATELET
BASOS ABS: 0.1 10*3/uL (ref 0.0–0.2)
Basos: 2 %
EOS (ABSOLUTE): 0.1 10*3/uL (ref 0.0–0.4)
Eos: 3 %
HEMOGLOBIN: 15.4 g/dL (ref 13.0–17.7)
Hematocrit: 45.8 % (ref 37.5–51.0)
IMMATURE GRANS (ABS): 0 10*3/uL (ref 0.0–0.1)
Immature Granulocytes: 0 %
LYMPHS: 27 %
Lymphocytes Absolute: 1 10*3/uL (ref 0.7–3.1)
MCH: 30.1 pg (ref 26.6–33.0)
MCHC: 33.6 g/dL (ref 31.5–35.7)
MCV: 90 fL (ref 79–97)
MONOCYTES: 13 %
Monocytes Absolute: 0.5 10*3/uL (ref 0.1–0.9)
Neutrophils Absolute: 2 10*3/uL (ref 1.4–7.0)
Neutrophils: 55 %
Platelets: 228 10*3/uL (ref 150–379)
RBC: 5.11 x10E6/uL (ref 4.14–5.80)
RDW: 12.7 % (ref 12.3–15.4)
WBC: 3.7 10*3/uL (ref 3.4–10.8)

## 2018-01-16 LAB — COMPREHENSIVE METABOLIC PANEL
ALBUMIN: 4.3 g/dL (ref 3.5–5.5)
ALT: 20 IU/L (ref 0–44)
AST: 19 IU/L (ref 0–40)
Albumin/Globulin Ratio: 1.9 (ref 1.2–2.2)
Alkaline Phosphatase: 62 IU/L (ref 39–117)
BUN / CREAT RATIO: 29 — AB (ref 9–20)
BUN: 22 mg/dL (ref 6–24)
Bilirubin Total: 0.4 mg/dL (ref 0.0–1.2)
CALCIUM: 9.3 mg/dL (ref 8.7–10.2)
CO2: 26 mmol/L (ref 20–29)
CREATININE: 0.77 mg/dL (ref 0.76–1.27)
Chloride: 103 mmol/L (ref 96–106)
GFR, EST AFRICAN AMERICAN: 116 mL/min/{1.73_m2} (ref >59)
GFR, EST NON AFRICAN AMERICAN: 101 mL/min/{1.73_m2} (ref >59)
GLOBULIN, TOTAL: 2.3 g/dL (ref 1.5–4.5)
Glucose: 105 mg/dL — ABNORMAL HIGH (ref 65–99)
Potassium: 4.5 mmol/L (ref 3.5–5.2)
SODIUM: 142 mmol/L (ref 134–144)
TOTAL PROTEIN: 6.6 g/dL (ref 6.0–8.5)

## 2018-01-16 LAB — LIPID PANEL
CHOLESTEROL TOTAL: 183 mg/dL (ref 100–199)
Chol/HDL Ratio: 3.8 ratio (ref 0.0–5.0)
HDL: 48 mg/dL (ref >39)
LDL Calculated: 127 mg/dL — ABNORMAL HIGH (ref 0–99)
Triglycerides: 42 mg/dL (ref 0–149)
VLDL Cholesterol Cal: 8 mg/dL (ref 5–40)

## 2018-01-16 LAB — HEMOGLOBIN A1C
ESTIMATED AVERAGE GLUCOSE: 126 mg/dL
HEMOGLOBIN A1C: 6 % — AB (ref 4.8–5.6)

## 2018-01-16 LAB — TSH: TSH: 0.535 u[IU]/mL (ref 0.450–4.500)

## 2018-01-16 LAB — PSA: PROSTATE SPECIFIC AG, SERUM: 0.6 ng/mL (ref 0.0–4.0)

## 2018-01-22 ENCOUNTER — Telehealth: Payer: Self-pay

## 2018-01-22 NOTE — Telephone Encounter (Signed)
Left message to call back  

## 2018-01-22 NOTE — Telephone Encounter (Signed)
Advised patient of results.  

## 2018-01-22 NOTE — Telephone Encounter (Signed)
-----   Message from [name]., MD sent at 01/21/2018  4:34 PM EDT ----- Labs good.

## 2018-02-04 ENCOUNTER — Ambulatory Visit: Payer: BC Managed Care – PPO | Admitting: Physician Assistant

## 2018-02-04 ENCOUNTER — Encounter: Payer: Self-pay | Admitting: Physician Assistant

## 2018-02-04 VITALS — BP 136/76 | HR 60 | Temp 98.7°F | Resp 16 | Wt 152.0 lb

## 2018-02-04 DIAGNOSIS — Z9109 Other allergy status, other than to drugs and biological substances: Secondary | ICD-10-CM | POA: Diagnosis not present

## 2018-02-04 NOTE — Patient Instructions (Addendum)
Take Xyzal OR Allegra or Zyrtec or claritin daily.     Allergies An allergy is when your body reacts to a substance in a way that is not normal. An allergic reaction can happen after you:  Eat something.  Breathe in something.  Touch something.  You can be allergic to:  Things that are only around during certain seasons, like molds and pollens.  Foods.  Drugs.  Insects.  Animal dander.  What are the signs or symptoms?  Puffiness (swelling). This may happen on the lips, face, tongue, mouth, or throat.  Sneezing.  Coughing.  Breathing loudly (wheezing).  Stuffy nose.  Tingling in the mouth.  A rash.  Itching.  Itchy, red, puffy areas of skin (hives).  Watery eyes.  Throwing up (vomiting).  Watery poop (diarrhea).  Dizziness.  Feeling faint or fainting.  Trouble breathing or swallowing.  A tight feeling in the chest.  A fast heartbeat. How is this diagnosed? Allergies can be diagnosed with:  A medical and family history.  Skin tests.  Blood tests.  A food diary. A food diary is a record of all the foods, drinks, and symptoms you have each day.  The results of an elimination diet. This diet involves making sure not to eat certain foods and then seeing what happens when you start eating them again.  How is this treated? There is no cure for allergies, but allergic reactions can be treated with medicine. Severe reactions usually need to be treated at a hospital. How is this prevented? The best way to prevent an allergic reaction is to avoid the thing you are allergic to. Allergy shots and medicines can also help prevent reactions in some cases. This information is not intended to replace advice given to you by your health care provider. Make sure you discuss any questions you have with your health care provider. Document Released: 02/01/2013 Document Revised: 06/03/2016 Document Reviewed: 07/19/2014 Elsevier Interactive Patient Education  [employer].

## 2018-02-04 NOTE — Progress Notes (Signed)
[place]  No chief complaint on file.   Subjective:    Patient ID: Darryl Larson, male    DOB: [birth year], 57 y.o.   MRN: [id number]  Upper Respiratory Infection: Darryl Larson is a 57 y.o. male  symptoms of a URI. Symptoms include congestion, cough and sore throat. Onset of symptoms was 5 days ago, gradually worsening since that time. He also c/o congestion, cough described as nonproductive, nasal congestion, post nasal drip, sinus pressure and sore throat for the past 5 days .  He is drinking moderate amounts of fluids. Evaluation to date: none. Treatment to date: decongestants. The treatment has provided minimal.   Review of Systems  Constitutional: Positive for fatigue. Negative for activity change, appetite change, chills, diaphoresis, fever and unexpected weight change.  HENT: Positive for congestion, ear discharge, ear pain, postnasal drip, rhinorrhea, sinus pressure, sinus pain and sore throat. Negative for dental problem, facial swelling, hearing loss, mouth sores, nosebleeds, sneezing, tinnitus, trouble swallowing and voice change.   Eyes: Negative.   Respiratory: Positive for cough and shortness of breath. Negative for apnea, choking, chest tightness, wheezing and stridor.   Gastrointestinal: Negative.   Neurological: Negative for dizziness, light-headedness and headaches.       Objective:   BP 136/76 (BP Location: Right Arm, Patient Position: Sitting, Cuff Size: Normal)   Pulse 60   Temp 98.7 F (37.1 C) (Oral)   Resp 16   Wt 152 lb (68.9 kg)   BMI 25.29 kg/m   Patient Active Problem List   Diagnosis Date Noted  . Adult attention deficit disorder 07/18/2015  . Clinical depression 07/18/2015  . Gastro-esophageal reflux disease without esophagitis 07/18/2015  . Borderline diabetes 07/18/2015  . Blood glucose elevated 07/18/2015  . HLD (hyperlipidemia) 07/18/2015  . Impingement syndrome of shoulder 07/18/2015  . Lipoma  of shoulder 07/18/2015    Outpatient Encounter Medications as of 02/04/2018  Medication Sig Note  . Cinnamon 500 MG TABS Take by mouth. 07/18/2015: Received from: [facility]  . fluticasone (FLONASE) 50 MCG/ACT nasal spray PLACE 2 SPRAYS INTO BOTH NOSTRILS DAILY AS NEEDED FOR ALLERGIES OR RHINITIS.   Marland Kitchen glucose blood test strip Needs one touch ultra strips check sugar once daily DX E11.9   . Melatonin 1 MG TABS Take by mouth. Reported on 01/22/2016 07/18/2015: Received from: [facility]  . MULTIPLE VITAMIN PO Take by mouth. 07/18/2015: Received from: [facility]  . naproxen (NAPROSYN) 500 MG tablet Take 1 tablet (500 mg total) by mouth 2 (two) times daily as needed.   Marland Kitchen aspirin 81 MG tablet Take by mouth. 07/18/2015: Received from: [facility]  . blood glucose meter kit and supplies KIT Dispense based on patient and insurance preference. Use 1-2 x week to monitor hyperglycemia. (Patient not taking: Reported on 02/04/2018)   . buPROPion (WELLBUTRIN XL) 300 MG 24 hr tablet Take 1 tablet (300 mg total) by mouth daily. (Patient not taking: Reported on 02/04/2018)   . clobetasol cream (TEMOVATE) 0.05 % APPLY TWICE DAILY TO RASH 07/19/2015: Received from: External Pharmacy  . ONE TOUCH ULTRA TEST test strip USE TO TEST BLOOD SUGAR LEVELS ONE TO TWO TIMES A WEEK   . OVER THE COUNTER MEDICATION Sunbiotic 364 dietary supplement   . OVER THE COUNTER MEDICATION Blood Balance Formula-Blood Flow Supplement   . zinc sulfate 220 (50 Zn) MG capsule Take 220 mg by mouth daily.    No facility-administered encounter medications on file  as of 02/04/2018.     No Known Allergies     Physical Exam  Constitutional: He is oriented to person, place, and time. He appears well-developed and well-nourished.  HENT:  Right Ear: External ear normal.  Left Ear: External ear normal.  Mouth/Throat: Posterior oropharyngeal erythema present. No oropharyngeal  exudate or posterior oropharyngeal edema.  Neck: Neck supple.  Cardiovascular: Normal rate and regular rhythm.  Pulmonary/Chest: Effort normal and breath sounds normal.  Lymphadenopathy:    He has no cervical adenopathy.  Neurological: He is alert and oriented to person, place, and time.  Skin: Skin is warm and dry.  Psychiatric: He has a normal mood and affect. His behavior is normal.       Assessment & Plan:  1. Environmental allergies  Counseled patient on course of allergies. He is taking flonase intermittently. He should take daily 2nd gen antihistamine 2 weeks before peak allergy season, generally begins in April with second peak around August-September. Can add singulair if not effective.  Return if symptoms worsen or fail to improve.  The entirety of the information documented in the History of Present Illness, Review of Systems and Physical Exam were personally obtained by me. Portions of this information were initially documented by Ashley Royalty, CMA and reviewed by me for thoroughness and accuracy.

## 2018-02-06 ENCOUNTER — Telehealth: Payer: Self-pay | Admitting: Physician Assistant

## 2018-02-06 DIAGNOSIS — J069 Acute upper respiratory infection, unspecified: Secondary | ICD-10-CM

## 2018-02-06 MED ORDER — AMOXICILLIN-POT CLAVULANATE 875-125 MG PO TABS: 1.0000 | ORAL_TABLET | Freq: Two times a day (BID) | ORAL | 0 refills | Status: DC

## 2018-02-06 NOTE — Telephone Encounter (Signed)
L/M advising below.  

## 2018-02-06 NOTE — Telephone Encounter (Signed)
Sent in Augmentin.

## 2018-02-06 NOTE — Telephone Encounter (Signed)
Pt stated that he saw Adriana on 02/04/18 and was advised if he wasn't feeling better to call the office to see about getting an antibiotic. Pt stated that his sore throat hasn't improved, still coughing, & congestion hasn't improved. Pt was advised that Fabio Bering is out of the office today as well as Dr. Rosanna Randy. Pt is requesting if another provider in the office can send an Rx to [facility] today. Please advise. Thanks TNP

## 2018-02-06 NOTE — Telephone Encounter (Signed)
Please review. Thanks!  

## 2018-03-17 ENCOUNTER — Ambulatory Visit: Payer: Self-pay | Admitting: Family Medicine

## 2018-06-01 LAB — HM DIABETES EYE EXAM

## 2018-07-19 ENCOUNTER — Encounter: Payer: Self-pay | Admitting: Family Medicine

## 2018-07-20 ENCOUNTER — Other Ambulatory Visit: Payer: Self-pay

## 2018-07-20 MED ORDER — GLUCOSE BLOOD VI STRP: ORAL_STRIP | 11 refills | Status: DC

## 2018-09-30 ENCOUNTER — Other Ambulatory Visit: Payer: Self-pay | Admitting: Family Medicine

## 2018-10-08 ENCOUNTER — Ambulatory Visit: Payer: BC Managed Care – PPO | Admitting: Family Medicine

## 2018-10-08 VITALS — BP 163/75 | HR 54 | Temp 98.2°F | Resp 16 | Wt 155.0 lb

## 2018-10-08 DIAGNOSIS — J019 Acute sinusitis, unspecified: Secondary | ICD-10-CM

## 2018-10-08 DIAGNOSIS — F988 Other specified behavioral and emotional disorders with onset usually occurring in childhood and adolescence: Secondary | ICD-10-CM | POA: Diagnosis not present

## 2018-10-08 MED ORDER — AZITHROMYCIN 250 MG PO TABS: ORAL_TABLET | ORAL | 0 refills | Status: DC

## 2018-10-08 NOTE — Progress Notes (Signed)
Darryl Larson  MRN: [id number] DOB: 27-Mar-1961  Subjective:  HPI   The patient is a 57 year old male who presents for evaluation of possible sinusitis.  The patient states his symptoms started 4-5 days ago.  He began with scratchy throat and now it has developed into head congestion with drainage of yellow phlegm, runny now and general malaise.  He is getting ready to travel to [place] over the holidays and is worried he may get sicker.  Patient Active Problem List   Diagnosis Date Noted  . Adult attention deficit disorder 07/18/2015  . Clinical depression 07/18/2015  . Gastro-esophageal reflux disease without esophagitis 07/18/2015  . Borderline diabetes 07/18/2015  . Blood glucose elevated 07/18/2015  . HLD (hyperlipidemia) 07/18/2015  . Impingement syndrome of shoulder 07/18/2015  . Lipoma of shoulder 07/18/2015    Past Medical History:  Diagnosis Date  . Acid reflux     Social History   Socioeconomic History  . Marital status: Divorced    Spouse name: Not on file  . Number of children: Not on file  . Years of education: Not on file  . Highest education level: Not on file  Occupational History  . Not on file  Social Needs  . Financial resource strain: Not on file  . Food insecurity:    Worry: Not on file    Inability: Not on file  . Transportation needs:    Medical: Not on file    Non-medical: Not on file  Tobacco Use  . Smoking status: Never Smoker  . Smokeless tobacco: Former Systems developer    Types: Chew  Substance and Sexual Activity  . Alcohol use: Yes    Alcohol/week: 0.0 standard drinks    Comment: 3 to 4 drinks a week  . Drug use: No  . Sexual activity: Not on file  Lifestyle  . Physical activity:    Days per week: Not on file    Minutes per session: Not on file  . Stress: Not on file  Relationships  . Social connections:    Talks on phone: Not on file    Gets together: Not on file    Attends religious service: Not on file    Active member of club  or organization: Not on file    Attends meetings of clubs or organizations: Not on file    Relationship status: Not on file  . Intimate partner violence:    Fear of current or ex partner: Not on file    Emotionally abused: Not on file    Physically abused: Not on file    Forced sexual activity: Not on file  Other Topics Concern  . Not on file  Social History Narrative  . Not on file    Outpatient Encounter Medications as of 10/08/2018  Medication Sig Note  . buPROPion (WELLBUTRIN XL) 300 MG 24 hr tablet TAKE 1 TABLET BY MOUTH EVERY DAY   . Cinnamon 500 MG TABS Take by mouth. 07/18/2015: Received from: [facility]  . glucose blood (ONE TOUCH ULTRA TEST) test strip USE TO TEST BLOOD SUGAR LEVELS ONE TO TWO TIMES A WEEK   . glucose blood test strip Needs one touch ultra strips check sugar once daily DX E11.9   . MULTIPLE VITAMIN PO Take by mouth. 07/18/2015: Received from: [facility]  . naproxen (NAPROSYN) 500 MG tablet Take 1 tablet (500 mg total) by mouth 2 (two) times daily as needed.   . clobetasol cream (TEMOVATE) 0.05 %  APPLY TWICE DAILY TO RASH 07/19/2015: Received from: External Pharmacy  . Melatonin 1 MG TABS Take by mouth. Reported on 01/22/2016 07/18/2015: Received from: [facility]  . [DISCONTINUED] amoxicillin-clavulanate (AUGMENTIN) 875-125 MG tablet Take 1 tablet by mouth 2 (two) times daily.   . [DISCONTINUED] aspirin 81 MG tablet Take by mouth. 07/18/2015: Received from: [facility]  . [DISCONTINUED] blood glucose meter kit and supplies KIT Dispense based on patient and insurance preference. Use 1-2 x week to monitor hyperglycemia. (Patient not taking: Reported on 02/04/2018)   . [DISCONTINUED] fluticasone (FLONASE) 50 MCG/ACT nasal spray PLACE 2 SPRAYS INTO BOTH NOSTRILS DAILY AS NEEDED FOR ALLERGIES OR RHINITIS.   . [DISCONTINUED] OVER THE COUNTER MEDICATION Sunbiotic 364 dietary supplement   .  [DISCONTINUED] OVER THE COUNTER MEDICATION Blood Balance Formula-Blood Flow Supplement   . [DISCONTINUED] zinc sulfate 220 (50 Zn) MG capsule Take 220 mg by mouth daily.    No facility-administered encounter medications on file as of 10/08/2018.     No Known Allergies  Review of Systems  Constitutional: Positive for malaise/fatigue. Negative for fever.  HENT: Positive for congestion. Negative for ear discharge, ear pain, hearing loss, nosebleeds, sinus pain, sore throat and tinnitus.   Eyes: Negative for blurred vision, double vision, photophobia, pain, discharge and redness.  Respiratory: Negative for cough, shortness of breath and wheezing.   Cardiovascular: Negative for chest pain, palpitations, orthopnea, claudication and leg swelling.  Gastrointestinal: Negative.   Genitourinary: Negative.   Musculoskeletal: Negative.   Neurological: Negative.   Endo/Heme/Allergies: Negative.   Psychiatric/Behavioral: Negative.     Objective:  BP (!) 163/75 (BP Location: Right Arm, Patient Position: Sitting, Cuff Size: Normal)   Pulse (!) 54   Temp 98.2 F (36.8 C) (Oral)   Resp 16   Wt 155 lb (70.3 kg)   BMI 25.79 kg/m   Physical Exam  Constitutional: He is oriented to person, place, and time and well-developed, well-nourished, and in no distress.  HENT:  Head: Normocephalic and atraumatic.  Right Ear: External ear normal.  Left Ear: External ear normal.  Nose: Nose normal.  Mouth/Throat: Oropharynx is clear and moist.  Eyes: Conjunctivae are normal.  Neck: No thyromegaly present.  Cardiovascular: Normal rate, regular rhythm and normal heart sounds.  Pulmonary/Chest: Effort normal and breath sounds normal.  Musculoskeletal:        General: No edema.  Neurological: He is alert and oriented to person, place, and time. GCS score is 15.  Skin: Skin is warm and dry.  Psychiatric: Mood, memory, affect and judgment normal.  Vitals reviewed.   Assessment and Plan :  1. Acute  sinusitis, recurrence not specified, unspecified location  - azithromycin (ZITHROMAX) 250 MG tablet; Take 2 on day 1 then 1 daily  Dispense: 6 tablet; Refill: 0 2.Possible ADD Discussed with pt.  I have done the exam and reviewed the chart and it is accurate to the best of my knowledge. Development worker, community has been used and  any errors in dictation or transcription are unintentional. Miguel Aschoff M.D. [facility]

## 2019-02-05 ENCOUNTER — Encounter: Payer: Self-pay | Admitting: Family Medicine

## 2019-02-05 DIAGNOSIS — R7303 Prediabetes: Secondary | ICD-10-CM

## 2019-02-05 MED ORDER — GLUCOSE BLOOD VI STRP: ORAL_STRIP | 11 refills | Status: DC

## 2019-02-15 ENCOUNTER — Encounter: Payer: Self-pay | Admitting: Family Medicine

## 2019-04-22 ENCOUNTER — Encounter: Payer: Self-pay | Admitting: Family Medicine

## 2019-04-22 ENCOUNTER — Other Ambulatory Visit: Payer: Self-pay

## 2019-04-22 ENCOUNTER — Ambulatory Visit (INDEPENDENT_AMBULATORY_CARE_PROVIDER_SITE_OTHER): Payer: BC Managed Care – PPO | Admitting: Family Medicine

## 2019-04-22 VITALS — BP 116/64 | HR 58 | Temp 98.4°F | Resp 16 | Ht 65.0 in | Wt 150.0 lb

## 2019-04-22 DIAGNOSIS — Z125 Encounter for screening for malignant neoplasm of prostate: Secondary | ICD-10-CM

## 2019-04-22 DIAGNOSIS — D126 Benign neoplasm of colon, unspecified: Secondary | ICD-10-CM

## 2019-04-22 DIAGNOSIS — D1720 Benign lipomatous neoplasm of skin and subcutaneous tissue of unspecified limb: Secondary | ICD-10-CM

## 2019-04-22 DIAGNOSIS — Z0000 Encounter for general adult medical examination without abnormal findings: Secondary | ICD-10-CM

## 2019-04-22 DIAGNOSIS — N419 Inflammatory disease of prostate, unspecified: Secondary | ICD-10-CM | POA: Diagnosis not present

## 2019-04-22 DIAGNOSIS — R739 Hyperglycemia, unspecified: Secondary | ICD-10-CM | POA: Diagnosis not present

## 2019-04-22 DIAGNOSIS — R5383 Other fatigue: Secondary | ICD-10-CM

## 2019-04-22 DIAGNOSIS — G3184 Mild cognitive impairment, so stated: Secondary | ICD-10-CM

## 2019-04-22 LAB — POCT URINALYSIS DIPSTICK
Bilirubin, UA: NEGATIVE
Glucose, UA: NEGATIVE
Ketones, UA: NEGATIVE
Leukocytes, UA: NEGATIVE
Nitrite, UA: NEGATIVE
Protein, UA: NEGATIVE
Spec Grav, UA: 1.025 (ref 1.010–1.025)
Urobilinogen, UA: 0.2 E.U./dL
pH, UA: 6 (ref 5.0–8.0)

## 2019-04-22 MED ORDER — SULFAMETHOXAZOLE-TRIMETHOPRIM 800-160 MG PO TABS: 1.0000 | ORAL_TABLET | Freq: Two times a day (BID) | ORAL | 0 refills | Status: AC

## 2019-04-22 NOTE — Progress Notes (Signed)
Patient: Darryl Larson, Male    DOB: Mar 18, 1961, 58 y.o.   MRN: [id number] Visit Date: 04/22/2019  Today's Provider: Wilhemena Durie, MD   Chief Complaint  Patient presents with  . Annual Exam  . decreased concentration   Subjective:     Annual physical exam Darryl Larson is a 58 y.o. male who presents today for health maintenance and complete physical. He feels well. He reports exercising not regularly, but he does stay active. He reports he is sleeping well. 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 Colonoscopy- 09/24/2011. 1 polyp removed. Tubular adenoma. Repeat 5 years.  Patient also mentions that he is having difficulty concentrating and focusing on things. He reports that is becoming more difficult for him to do daily tasks such as reading. He was previously on Wellbutrin for this, but he discontinued the medication because he felt it did not help.  He is frustrated because he cannot learn conversational spanish for his girlfriend. Review of Systems  Constitutional: Positive for fatigue.  HENT: Negative.   Eyes: Negative.   Respiratory: Negative.   Cardiovascular: Negative.   Gastrointestinal: Negative.   Genitourinary: Negative.   Musculoskeletal: Positive for arthralgias and back pain.  Skin: Negative.   Allergic/Immunologic: Positive for environmental allergies.  Neurological: Negative.   Psychiatric/Behavioral: Positive for decreased concentration.    Social History      He  reports that he has never smoked. He has quit using smokeless tobacco.  His smokeless tobacco use included chew. He reports current alcohol use. He reports that he does not use drugs.       Social History   Socioeconomic History  . Marital status: Divorced    Spouse name: Not on file  . Number of children: Not on file  . Years of education: Not on file  . Highest education level: Not on file  Occupational History  . Not on file  Social Needs  . Financial  resource strain: Not on file  . Food insecurity    Worry: Not on file    Inability: Not on file  . Transportation needs    Medical: Not on file    Non-medical: Not on file  Tobacco Use  . Smoking status: Never Smoker  . Smokeless tobacco: Former Systems developer    Types: Chew  Substance and Sexual Activity  . Alcohol use: Yes    Alcohol/week: 0.0 standard drinks    Comment: 3 to 4 drinks a week  . Drug use: No  . Sexual activity: Not on file  Lifestyle  . Physical activity    Days per week: Not on file    Minutes per session: Not on file  . Stress: Not on file  Relationships  . Social Herbalist on phone: Not on file    Gets together: Not on file    Attends religious service: Not on file    Active member of club or organization: Not on file    Attends meetings of clubs or organizations: Not on file    Relationship status: Not on file  Other Topics Concern  . Not on file  Social History Narrative  . Not on file    Past Medical History:  Diagnosis Date  . Acid reflux      Patient Active Problem List   Diagnosis Date Noted  . Adult attention deficit disorder 07/18/2015  . Clinical depression 07/18/2015  . Gastro-esophageal reflux disease without  esophagitis 07/18/2015  . Borderline diabetes 07/18/2015  . Blood glucose elevated 07/18/2015  . HLD (hyperlipidemia) 07/18/2015  . Impingement syndrome of shoulder 07/18/2015  . Lipoma of shoulder 07/18/2015    Past Surgical History:  Procedure Laterality Date  . VASECTOMY      Family History        Family Status  Relation Name Status  . Mother  Deceased  . Father  Deceased at age 55  . Brother 1 Alive  . Daughter  Alive  . Annamarie Major  Deceased  . Brother 2 Alive  . Brother 3 Deceased       suicide  . Ethlyn Daniels  (Not Specified)  . Neg Hx  (Not Specified)        His family history includes Alcohol abuse in his father; Breast cancer in his mother; Cancer in his paternal aunt; Depression in his brother,  brother, and father; Diabetes in his mother; Healthy in his brother and daughter; Hodgkin's lymphoma in his paternal uncle; Pancreatic cancer in his mother; Throat cancer in his father. There is no history of Prostate cancer.      No Known Allergies   Current Outpatient Medications:  .  azithromycin (ZITHROMAX) 250 MG tablet, Take 2 on day 1 then 1 daily, Disp: 6 tablet, Rfl: 0 .  buPROPion (WELLBUTRIN XL) 300 MG 24 hr tablet, TAKE 1 TABLET BY MOUTH EVERY DAY, Disp: 90 tablet, Rfl: 2 .  Cinnamon 500 MG TABS, Take by mouth., Disp: , Rfl:  .  clobetasol cream (TEMOVATE) 0.05 %, APPLY TWICE DAILY TO RASH, Disp: , Rfl: 3 .  glucose blood (ONE TOUCH ULTRA TEST) test strip, USE TO TEST BLOOD SUGAR LEVELS ONE TO TWO TIMES A WEEK, Disp: 100 each, Rfl: 11 .  Melatonin 1 MG TABS, Take by mouth. Reported on 01/22/2016, Disp: , Rfl:  .  MULTIPLE VITAMIN PO, Take by mouth., Disp: , Rfl:  .  naproxen (NAPROSYN) 500 MG tablet, Take 1 tablet (500 mg total) by mouth 2 (two) times daily as needed., Disp: 60 tablet, Rfl: 12   Patient Care Team: [name]., MD as PCP - General (Family Medicine)    Objective:    Vitals: BP 116/64   Pulse (!) 58   Temp 98.4 F (36.9 C)   Resp 16   Ht 5\' 5"  (1.651 m)   Wt 150 lb (68 kg)   SpO2 98%   BMI 24.96 kg/m    Vitals:   04/22/19 1516  BP: 116/64  Pulse: (!) 58  Resp: 16  Temp: 98.4 F (36.9 C)  SpO2: 98%  Weight: 150 lb (68 kg)  Height: 5\' 5"  (1.651 m)     Physical Exam Vitals signs reviewed.  Constitutional:      Appearance: Normal appearance. He is well-developed.  HENT:     Head: Normocephalic and atraumatic.     Right Ear: Tympanic membrane and external ear normal.     Left Ear: Tympanic membrane and external ear normal.     Nose: Nose normal.  Eyes:     General: No scleral icterus.    Conjunctiva/sclera: Conjunctivae normal.  Neck:     Thyroid: No thyromegaly.  Cardiovascular:     Rate and Rhythm: Normal rate and regular  rhythm.     Heart sounds: Normal heart sounds.  Pulmonary:     Effort: Pulmonary effort is normal.     Breath sounds: Normal breath sounds.  Abdominal:     Palpations:  Abdomen is soft.  Genitourinary:    Penis: Normal.      Scrotum/Testes: Normal.  Musculoskeletal: Normal range of motion.  Lymphadenopathy:     Cervical: No cervical adenopathy.  Skin:    General: Skin is warm and dry.  Neurological:     General: No focal deficit present.     Mental Status: He is alert and oriented to person, place, and time.  Psychiatric:        Mood and Affect: Mood normal.        Behavior: Behavior normal.        Thought Content: Thought content normal.        Judgment: Judgment normal.      Depression Screen PHQ 2/9 Scores 04/22/2019 01/15/2018 10/09/2016 01/22/2016  PHQ - 2 Score 0 2 3 1   PHQ- 9 Score - 8 9 -       Assessment & Plan:     Routine Health Maintenance and Physical Exam  Exercise Activities and Dietary recommendations Goals   None     Immunization History  Administered Date(s) Administered  . Pneumococcal Polysaccharide-23 12/02/2012  . Tdap 11/27/2011    Health Maintenance  Topic Date Due  . HIV Screening  12/05/1975  . INFLUENZA VACCINE  05/22/2019  . COLONOSCOPY  09/23/2021  . TETANUS/TDAP  11/26/2021  . Hepatitis C Screening  Addressed     Discussed health benefits of physical activity, and encouraged him to engage in regular exercise appropriate for his age and condition.   1. Annual physical exam  - CBC with Differential/Platelet - Comprehensive metabolic panel - Lipid panel - TSH - POCT urinalysis dipstick  2. Prostate cancer screening  - PSA  3. Elevated blood sugar  - Hemoglobin A1c  4. Prostatitis, unspecified prostatitis type  clumps of groups of WBC/RBC. - sulfamethoxazole-trimethoprim (BACTRIM DS) 800-160 MG tablet; Take 1 tablet by mouth 2 (two) times daily for 10 days.  Dispense: 20 tablet; Refill: 0 - Urine Culture 5. Lipoma  of shoulder   6. Tubular adenoma of colon Needs f/u colonoscopy.  7. MCI (mild cognitive impairment) with memory loss Stop Bupropion.May need neurocognitive evaluation. MMSE next visit.  8. Fatigue, unspecified type   --------------------------------------------------------------------   I have done the exam and reviewed the above chart and it is accurate to the best of my knowledge. Development worker, community has been used in this note in any air is in the dictation or transcription are unintentional.  Wilhemena Durie, MD  [facility]

## 2019-04-24 LAB — URINE CULTURE: Organism ID, Bacteria: NO GROWTH

## 2019-04-26 ENCOUNTER — Encounter: Payer: Self-pay | Admitting: Family Medicine

## 2019-04-27 ENCOUNTER — Telehealth: Payer: Self-pay

## 2019-04-27 LAB — COMPREHENSIVE METABOLIC PANEL
ALT: 45 IU/L — ABNORMAL HIGH (ref 0–44)
AST: 30 IU/L (ref 0–40)
Albumin/Globulin Ratio: 2 (ref 1.2–2.2)
Albumin: 4.2 g/dL (ref 3.8–4.9)
Alkaline Phosphatase: 57 IU/L (ref 39–117)
BUN/Creatinine Ratio: 11 (ref 9–20)
BUN: 11 mg/dL (ref 6–24)
Bilirubin Total: 0.5 mg/dL (ref 0.0–1.2)
CO2: 21 mmol/L (ref 20–29)
Calcium: 9.4 mg/dL (ref 8.7–10.2)
Chloride: 106 mmol/L (ref 96–106)
Creatinine, Ser: 0.96 mg/dL (ref 0.76–1.27)
GFR calc Af Amer: 100 mL/min/{1.73_m2} (ref >59)
GFR calc non Af Amer: 87 mL/min/{1.73_m2} (ref >59)
Globulin, Total: 2.1 g/dL (ref 1.5–4.5)
Glucose: 121 mg/dL — ABNORMAL HIGH (ref 65–99)
Potassium: 4.6 mmol/L (ref 3.5–5.2)
Sodium: 141 mmol/L (ref 134–144)
Total Protein: 6.3 g/dL (ref 6.0–8.5)

## 2019-04-27 LAB — HEMOGLOBIN A1C
Est. average glucose Bld gHb Est-mCnc: 128 mg/dL
Hgb A1c MFr Bld: 6.1 % — ABNORMAL HIGH (ref 4.8–5.6)

## 2019-04-27 LAB — CBC WITH DIFFERENTIAL/PLATELET
Basophils Absolute: 0.1 10*3/uL (ref 0.0–0.2)
Basos: 2 %
EOS (ABSOLUTE): 0.1 10*3/uL (ref 0.0–0.4)
Eos: 2 %
Hematocrit: 46.9 % (ref 37.5–51.0)
Hemoglobin: 15.7 g/dL (ref 13.0–17.7)
Immature Grans (Abs): 0 10*3/uL (ref 0.0–0.1)
Immature Granulocytes: 0 %
Lymphocytes Absolute: 1 10*3/uL (ref 0.7–3.1)
Lymphs: 27 %
MCH: 29.6 pg (ref 26.6–33.0)
MCHC: 33.5 g/dL (ref 31.5–35.7)
MCV: 89 fL (ref 79–97)
Monocytes Absolute: 0.4 10*3/uL (ref 0.1–0.9)
Monocytes: 11 %
Neutrophils Absolute: 2.1 10*3/uL (ref 1.4–7.0)
Neutrophils: 58 %
Platelets: 268 10*3/uL (ref 150–450)
RBC: 5.3 x10E6/uL (ref 4.14–5.80)
RDW: 11.9 % (ref 11.6–15.4)
WBC: 3.7 10*3/uL (ref 3.4–10.8)

## 2019-04-27 LAB — LIPID PANEL
Chol/HDL Ratio: 4.2 ratio (ref 0.0–5.0)
Cholesterol, Total: 200 mg/dL — ABNORMAL HIGH (ref 100–199)
HDL: 48 mg/dL (ref >39)
LDL Calculated: 136 mg/dL — ABNORMAL HIGH (ref 0–99)
Triglycerides: 82 mg/dL (ref 0–149)
VLDL Cholesterol Cal: 16 mg/dL (ref 5–40)

## 2019-04-27 LAB — PSA: Prostate Specific Ag, Serum: 0.8 ng/mL (ref 0.0–4.0)

## 2019-04-27 LAB — TSH: TSH: 0.697 u[IU]/mL (ref 0.450–4.500)

## 2019-04-27 NOTE — Telephone Encounter (Signed)
Patient notified of lab results

## 2019-04-27 NOTE — Telephone Encounter (Signed)
-----   Message from [name]., MD sent at 04/27/2019  8:25 AM EDT ----- Labs stable.

## 2019-04-28 ENCOUNTER — Other Ambulatory Visit: Payer: Self-pay

## 2019-04-28 DIAGNOSIS — D126 Benign neoplasm of colon, unspecified: Secondary | ICD-10-CM

## 2019-04-28 DIAGNOSIS — Z1211 Encounter for screening for malignant neoplasm of colon: Secondary | ICD-10-CM

## 2019-04-28 NOTE — Progress Notes (Signed)
Order for colonoscopy placed.

## 2019-05-19 ENCOUNTER — Encounter: Payer: Self-pay | Admitting: Family Medicine

## 2019-05-19 ENCOUNTER — Telehealth: Payer: Self-pay | Admitting: Family Medicine

## 2019-05-19 ENCOUNTER — Encounter: Payer: Self-pay | Admitting: *Deleted

## 2019-05-19 NOTE — Telephone Encounter (Signed)
Pt needing a call back regarding a colonoscopy recheck from [facility]. Pt states he had the colonoscopy last year.  Needing to know if this is needed and why?  Please advise.  Thanks, [employer]

## 2019-05-19 NOTE — Telephone Encounter (Signed)
Spoke to patient regarding referral. He states that he doesn't need a colonoscopy. He was contacted by GI. Advised patient to follow up with them to change or cancel his appointment.

## 2019-06-03 ENCOUNTER — Encounter: Payer: Self-pay | Admitting: Family Medicine

## 2019-06-24 ENCOUNTER — Other Ambulatory Visit: Payer: Self-pay

## 2019-06-24 ENCOUNTER — Ambulatory Visit: Payer: BC Managed Care – PPO | Admitting: Family Medicine

## 2019-06-24 ENCOUNTER — Encounter: Payer: Self-pay | Admitting: Family Medicine

## 2019-06-24 VITALS — BP 118/70 | HR 55 | Temp 98.1°F | Resp 18 | Wt 146.8 lb

## 2019-06-24 DIAGNOSIS — R319 Hematuria, unspecified: Secondary | ICD-10-CM

## 2019-06-24 DIAGNOSIS — G3184 Mild cognitive impairment, so stated: Secondary | ICD-10-CM

## 2019-06-24 DIAGNOSIS — R7303 Prediabetes: Secondary | ICD-10-CM | POA: Diagnosis not present

## 2019-06-24 LAB — POCT URINALYSIS DIPSTICK
Bilirubin, UA: 0.2
Glucose, UA: NEGATIVE
Ketones, UA: NEGATIVE
Leukocytes, UA: NEGATIVE
Nitrite, UA: NEGATIVE
Protein, UA: NEGATIVE
Spec Grav, UA: 1.025 (ref 1.010–1.025)
Urobilinogen, UA: 0.2 E.U./dL
pH, UA: 7.5 (ref 5.0–8.0)

## 2019-06-24 NOTE — Progress Notes (Signed)
Patient: Darryl Larson Male    DOB: [birth year]   58 y.o.   MRN: [id number] Visit Date: 06/24/2019  Today's Provider: Wilhemena Durie, MD   No chief complaint on file.  Subjective:     HPI  2 Month follow up for memory. Patient still having issues recalling information at times.  He wonders if he could have ADD or early memory loss. He is a retired Education officer, museum, now works on a farm. No Known Allergies   Current Outpatient Medications:  .  Cinnamon 500 MG TABS, Take by mouth., Disp: , Rfl:  .  Ginkgo Biloba Extract 60 MG CAPS, Take 1 capsule by mouth every morning., Disp: , Rfl:  .  glucose blood (ONE TOUCH ULTRA TEST) test strip, USE TO TEST BLOOD SUGAR LEVELS ONE TO TWO TIMES A WEEK, Disp: 100 each, Rfl: 11 .  Melatonin 1 MG TABS, Take by mouth. Reported on 01/22/2016, Disp: , Rfl:  .  MULTIPLE VITAMIN PO, Take by mouth., Disp: , Rfl:  .  Omega-3 Fatty Acids (FISH OIL BURP-LESS) 500 MG CAPS, Take 1 capsule by mouth., Disp: , Rfl:  .  azithromycin (ZITHROMAX) 250 MG tablet, Take 2 on day 1 then 1 daily, Disp: 6 tablet, Rfl: 0 .  buPROPion (WELLBUTRIN XL) 300 MG 24 hr tablet, TAKE 1 TABLET BY MOUTH EVERY DAY, Disp: 90 tablet, Rfl: 2 .  clobetasol cream (TEMOVATE) 0.05 %, APPLY TWICE DAILY TO RASH, Disp: , Rfl: 3 .  naproxen (NAPROSYN) 500 MG tablet, Take 1 tablet (500 mg total) by mouth 2 (two) times daily as needed., Disp: 60 tablet, Rfl: 12  Review of Systems  Constitutional: Positive for fatigue.  Respiratory: Negative.   Cardiovascular: Negative.   Gastrointestinal: Negative.   Endocrine: Negative.   Allergic/Immunologic: Negative.   Neurological: Negative.   Hematological: Negative.   Psychiatric/Behavioral: Positive for decreased concentration.    Social History   Tobacco Use  . Smoking status: Never Smoker  . Smokeless tobacco: Former Systems developer    Types: Chew  Substance Use Topics  . Alcohol use: Yes    Alcohol/week: 0.0 standard drinks    Comment: 3 to 4  drinks a week      Objective:   BP 118/70 (BP Location: Right Arm, Patient Position: Sitting, Cuff Size: Normal)   Pulse (!) 55   Temp 98.1 F (36.7 C) (Temporal)   Resp 18   Wt 146 lb 12.8 oz (66.6 kg)   SpO2 98%   BMI 24.43 kg/m  Vitals:   06/24/19 1542  BP: 118/70  Pulse: (!) 55  Resp: 18  Temp: 98.1 F (36.7 C)  TempSrc: Temporal  SpO2: 98%  Weight: 146 lb 12.8 oz (66.6 kg)  Body mass index is 24.43 kg/m.   Physical Exam Vitals signs reviewed.  Constitutional:      Appearance: Normal appearance.  HENT:     Head: Normocephalic and atraumatic.  Eyes:     General: No scleral icterus. Cardiovascular:     Rate and Rhythm: Normal rate and regular rhythm.     Pulses: Normal pulses.     Heart sounds: Normal heart sounds.  Pulmonary:     Breath sounds: Normal breath sounds.  Abdominal:     Palpations: Abdomen is soft.  Neurological:     General: No focal deficit present.     Mental Status: He is alert and oriented to person, place, and time.  Psychiatric:  Mood and Affect: Mood normal.        Behavior: Behavior normal.        Thought Content: Thought content normal.        Judgment: Judgment normal.      No results found for any visits on 06/24/19.     Assessment & Plan    .1. Hematuria, unspecified type Trace blood on dip--micro 1-2 RBCs per HPF.Urology referral for any issues. - POCT urinalysis dipstick  2. Borderline diabetes   3. MCI (mild cognitive impairment) with memory loss No indication of impairment but pt is worried. Refer for neurocognitive evaluation Dr Lennon Alstrom  --[place], MD  [facility]

## 2019-06-30 ENCOUNTER — Other Ambulatory Visit: Payer: Self-pay

## 2019-06-30 DIAGNOSIS — G3184 Mild cognitive impairment, so stated: Secondary | ICD-10-CM

## 2019-07-23 LAB — HM DIABETES EYE EXAM

## 2019-11-23 ENCOUNTER — Encounter: Payer: Self-pay | Admitting: Family Medicine

## 2019-11-23 ENCOUNTER — Other Ambulatory Visit: Payer: Self-pay

## 2019-11-23 MED ORDER — BUPROPION HCL ER (XL) 300 MG PO TB24: 300.0000 mg | ORAL_TABLET | Freq: Every day | ORAL | 2 refills | Status: DC

## 2019-12-22 ENCOUNTER — Other Ambulatory Visit: Payer: Self-pay

## 2019-12-22 ENCOUNTER — Ambulatory Visit: Payer: BC Managed Care – PPO | Admitting: Family Medicine

## 2019-12-22 ENCOUNTER — Encounter: Payer: Self-pay | Admitting: Family Medicine

## 2019-12-22 VITALS — BP 128/68 | HR 61 | Temp 97.5°F | Resp 16 | Wt 149.0 lb

## 2019-12-22 DIAGNOSIS — N410 Acute prostatitis: Secondary | ICD-10-CM

## 2019-12-22 DIAGNOSIS — R7303 Prediabetes: Secondary | ICD-10-CM

## 2019-12-22 DIAGNOSIS — R319 Hematuria, unspecified: Secondary | ICD-10-CM

## 2019-12-22 DIAGNOSIS — F988 Other specified behavioral and emotional disorders with onset usually occurring in childhood and adolescence: Secondary | ICD-10-CM | POA: Diagnosis not present

## 2019-12-22 LAB — POCT URINALYSIS DIPSTICK
Bilirubin, UA: NEGATIVE
Glucose, UA: NEGATIVE
Ketones, UA: NEGATIVE
Leukocytes, UA: NEGATIVE
Nitrite, UA: NEGATIVE
Protein, UA: NEGATIVE
Spec Grav, UA: >=1.03 — AB (ref 1.010–1.025)
Urobilinogen, UA: 0.2 E.U./dL
pH, UA: 6 (ref 5.0–8.0)

## 2019-12-22 LAB — POCT GLYCOSYLATED HEMOGLOBIN (HGB A1C)
Est. average glucose Bld gHb Est-mCnc: 123
Hemoglobin A1C: 5.9 % — AB (ref 4.0–5.6)

## 2019-12-22 MED ORDER — SULFAMETHOXAZOLE-TRIMETHOPRIM 800-160 MG PO TABS: 1.0000 | ORAL_TABLET | Freq: Two times a day (BID) | ORAL | 0 refills | Status: DC

## 2019-12-22 NOTE — Progress Notes (Signed)
Patient: Darryl Larson Male    DOB: [birth year]   59 y.o.   MRN: [id number] Visit Date: 12/22/2019  Today's Provider: Wilhemena Durie, MD   Chief Complaint  Patient presents with  . Hyperglycemia   Subjective:     HPI  Patient and his girlfriend of several years broke up 2 weeks ago and he is sad about this but not depressed or agitated. Not homicidal or suicidal. He is having no other issues and decided not to pursue an ADD work-up.  He is divorced and is worried about his 3 year old daughter who lives alone in [place].  He has a son who is doing well. Hematuria, unspecified type From 06/24/2019-Urology referral for any issues.  Borderline diabetes From 04/22/2019-Hemoglobin A1c6.1.  MCI (mild cognitive impairment) with memory loss From 06/24/2019-MMSE obtained. No indication of impairment but patient is worried. Referred for neurocognitive evaluation. Dr Lennon Alstrom  --[phone]   No Known Allergies   Current Outpatient Medications:  .  buPROPion (WELLBUTRIN XL) 300 MG 24 hr tablet, Take 1 tablet (300 mg total) by mouth daily., Disp: 90 tablet, Rfl: 2 .  Cinnamon 500 MG TABS, Take by mouth., Disp: , Rfl:  .  glucose blood (ONE TOUCH ULTRA TEST) test strip, USE TO TEST BLOOD SUGAR LEVELS ONE TO TWO TIMES A WEEK, Disp: 100 each, Rfl: 11 .  Melatonin 1 MG TABS, Take by mouth. Reported on 01/22/2016, Disp: , Rfl:  .  MULTIPLE VITAMIN PO, Take by mouth., Disp: , Rfl:  .  Omega-3 Fatty Acids (FISH OIL BURP-LESS) 500 MG CAPS, Take 1 capsule by mouth., Disp: , Rfl:   Review of Systems  Constitutional: Negative for appetite change, chills and fever.  Respiratory: Negative for chest tightness, shortness of breath and wheezing.   Cardiovascular: Negative for chest pain and palpitations.  Gastrointestinal: Negative for abdominal pain, nausea and vomiting.  Genitourinary: Negative.     Social History   Tobacco Use  . Smoking status: Never Smoker  . Smokeless  tobacco: Former Systems developer    Types: Chew  Substance Use Topics  . Alcohol use: Yes    Alcohol/week: 0.0 standard drinks    Comment: 3 to 4 drinks a week      Objective:   BP 128/68 (BP Location: Left Arm, Patient Position: Sitting, Cuff Size: Normal)   Pulse 61   Temp (!) 97.5 F (36.4 C) (Temporal)   Resp 16   Wt 149 lb (67.6 kg)   SpO2 95% Comment: room air  BMI 24.79 kg/m  Vitals:   12/22/19 1501  BP: 128/68  Pulse: 61  Resp: 16  Temp: (!) 97.5 F (36.4 C)  TempSrc: Temporal  SpO2: 95%  Weight: 149 lb (67.6 kg)  Body mass index is 24.79 kg/m.   Physical Exam Vitals reviewed.  Constitutional:      Appearance: Normal appearance.  HENT:     Head: Normocephalic and atraumatic.  Eyes:     General: No scleral icterus. Cardiovascular:     Rate and Rhythm: Normal rate and regular rhythm.     Pulses: Normal pulses.     Heart sounds: Normal heart sounds.  Pulmonary:     Breath sounds: Normal breath sounds.  Abdominal:     Palpations: Abdomen is soft.  Neurological:     General: No focal deficit present.     Mental Status: He is alert and oriented to person, place, and time.  Psychiatric:  Mood and Affect: Mood normal.        Behavior: Behavior normal.        Thought Content: Thought content normal.        Judgment: Judgment normal.      No results found for any visits on 12/22/19.     Assessment & Plan    1. Borderline diabetes A1c of 5.9 so patient is hyperglycemic but not diabetic.  You to work on diet and exercise. - POCT HgB A1C  2. Hematuria, unspecified type Urinalysis shows hemolyzed blood on dipstick - POCT Urinalysis Dipstick  3. Acute/chronic prostatitis Recheck the scopic urinalysis shows 5-10 red cells and white cells per high-power field.  Treat for prostatitis.  On next visit recheck urine.  Urology referral if the hematuria persists.  He is having no gross hematuria at all. - sulfamethoxazole-trimethoprim (BACTRIM DS) 800-160 MG  tablet; Take 1 tablet by mouth 2 (two) times daily.  Dispense: 20 tablet; Refill: 0  4. Adult attention deficit disorder Has decided not to pursue this evaluation     Wilhemena Durie, MD  [facility]

## 2020-01-27 ENCOUNTER — Telehealth: Payer: BC Managed Care – PPO | Admitting: Physician Assistant

## 2020-01-27 ENCOUNTER — Other Ambulatory Visit: Payer: Self-pay

## 2020-01-27 ENCOUNTER — Encounter: Payer: Self-pay | Admitting: Emergency Medicine

## 2020-01-27 ENCOUNTER — Ambulatory Visit
Admission: EM | Admit: 2020-01-27 | Discharge: 2020-01-27 | Disposition: A | Discharge status: Home / Self Care | Payer: BC Managed Care – PPO | Attending: Family Medicine | Admitting: Family Medicine

## 2020-01-27 ENCOUNTER — Ambulatory Visit (INDEPENDENT_AMBULATORY_CARE_PROVIDER_SITE_OTHER): Payer: BC Managed Care – PPO

## 2020-01-27 DIAGNOSIS — M545 Low back pain, unspecified: Secondary | ICD-10-CM

## 2020-01-27 DIAGNOSIS — W19XXXA Unspecified fall, initial encounter: Secondary | ICD-10-CM

## 2020-01-27 DIAGNOSIS — S20229A Contusion of unspecified back wall of thorax, initial encounter: Secondary | ICD-10-CM

## 2020-01-27 HISTORY — DX: Other specified behavioral and emotional disorders with onset usually occurring in childhood and adolescence: F98.8

## 2020-01-27 HISTORY — DX: Depression, unspecified: F32.A

## 2020-01-27 MED ORDER — MELOXICAM 15 MG PO TABS: 15.0000 mg | ORAL_TABLET | Freq: Every day | ORAL | 0 refills | Status: DC | PRN

## 2020-01-27 NOTE — ED Provider Notes (Signed)
[facility]    CSN: [id number] Arrival date & time: 01/27/20  1402      History   Chief Complaint Chief Complaint  Patient presents with  . Back Injury    DOI 01/23/20   HPI  59 year old male presents with the above complaint.  Patient reports that on Sunday he was carrying a couch.  He subsequently fell backwards and hit his low back on the edge of a concrete step.  He reports swelling in the back.  He reports ongoing back pain.  Pain 8/10 in severity.  Worse with certain activities.  He has taken ibuprofen without complete resolution.  Patient has bruising as well.  No other injuries.  No other complaints or concerns at this time.  Past Medical History:  Diagnosis Date  . Acid reflux   . ADD (attention deficit disorder)   . Depression    Patient Active Problem List   Diagnosis Date Noted  . Adult attention deficit disorder 07/18/2015  . Clinical depression 07/18/2015  . Gastro-esophageal reflux disease without esophagitis 07/18/2015  . Borderline diabetes 07/18/2015  . Blood glucose elevated 07/18/2015  . HLD (hyperlipidemia) 07/18/2015  . Impingement syndrome of shoulder 07/18/2015  . Lipoma of shoulder 07/18/2015   Past Surgical History:  Procedure Laterality Date  . VASECTOMY     Home Medications    Prior to Admission medications   Medication Sig Start Date End Date Taking? Authorizing Provider  buPROPion (WELLBUTRIN XL) 300 MG 24 hr tablet Take 1 tablet (300 mg total) by mouth daily. 11/23/19  Yes [name]., MD  Cinnamon 500 MG TABS Take by mouth. 01/23/12  Yes [provider]  MULTIPLE VITAMIN PO Take by mouth. 01/23/12  Yes [provider]  Omega-3 Fatty Acids (FISH OIL BURP-LESS) 500 MG CAPS Take 1 capsule by mouth.   Yes [provider]  meloxicam (MOBIC) 15 MG tablet Take 1 tablet (15 mg total) by mouth daily as needed for pain. 01/27/20   Coral Spikes, DO    Family History Family History  Problem Relation  Age of Onset  . Breast cancer Mother   . Diabetes Mother   . Pancreatic cancer Mother   . Throat cancer Father   . Alcohol abuse Father   . Depression Father   . Healthy Brother   . Healthy Daughter   . Hodgkin's lymphoma Paternal Uncle   . Depression Brother   . Depression Brother   . Cancer Paternal Aunt   . Prostate cancer Neg Hx     Social History Social History   Tobacco Use  . Smoking status: Never Smoker  . Smokeless tobacco: Former Systems developer    Types: Chew  Substance Use Topics  . Alcohol use: Yes    Alcohol/week: 0.0 standard drinks    Comment: 3 to 4 drinks a week  . Drug use: No     Allergies   Patient has no known allergies.   Review of Systems Review of Systems  Musculoskeletal: Positive for back pain.   Physical Exam Triage Vital Signs ED Triage Vitals  Enc Vitals Group     BP 01/27/20 1416 137/78     Pulse Rate 01/27/20 1416 (!) 59     Resp 01/27/20 1416 18     Temp 01/27/20 1416 98.3 F (36.8 C)     Temp Source 01/27/20 1416 Oral     SpO2 01/27/20 1416 100 %     Weight 01/27/20 1417 150 lb (68  kg)     Height 01/27/20 1417 5\' 5"  (1.651 m)     Head Circumference --      Peak Flow --      Pain Score 01/27/20 1415 8     Pain Loc --      Pain Edu? --      Excl. in [place]? --    Updated Vital Signs BP 137/78 (BP Location: Left Arm)   Pulse (!) 59   Temp 98.3 F (36.8 C) (Oral)   Resp 18   Ht 5\' 5"  (1.651 m)   Wt 68 kg   SpO2 100%   BMI 24.96 kg/m   Visual Acuity Right Eye Distance:   Left Eye Distance:   Bilateral Distance:    Right Eye Near:   Left Eye Near:    Bilateral Near:     Physical Exam Constitutional:      General: He is not in acute distress.    Appearance: Normal appearance. He is not ill-appearing.  HENT:     Head: Normocephalic and atraumatic.  Cardiovascular:     Rate and Rhythm: Normal rate and regular rhythm.  Pulmonary:     Effort: Pulmonary effort is normal.     Breath sounds: Normal breath sounds. No  wheezing, rhonchi or rales.  Musculoskeletal:     Comments: Lumbar spine -skin with bruising.  Exquisite tenderness to palpation in the midline of the lumbar spine.  Neurological:     Mental Status: He is alert.  Psychiatric:        Mood and Affect: Mood normal.        Behavior: Behavior normal.    UC Treatments / Results  Labs (all labs ordered are listed, but only abnormal results are displayed) Labs Reviewed - No data to display  EKG   Radiology DG Lumbar Spine Complete  Result Date: 01/27/2020 CLINICAL DATA:  Back pain for 4 days. Fall carrying a couch up a flight of stairs. EXAM: LUMBAR SPINE - COMPLETE 4+ VIEW COMPARISON:  None. FINDINGS: Hemi transitional lumbosacral anatomy with enlarged left transverse process pseudo articulating with the sacrum no evidence of acute fracture. Alignment is maintained. Lumbar disc spaces are preserved. Minor facet hypertrophy at the lumbosacral junction. Mild endplate spurring. No evidence of focal bone lesion. Sacroiliac joints are congruent. IMPRESSION: 1. No acute fracture of the lumbar spine. 2. 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 Electronically Signed   By: Keith Rake M.D.   On: 01/27/2020 15:13    Procedures Procedures (including critical care time)  Medications Ordered in UC Medications - No data to display  Initial Impression / Assessment and Plan / UC Course  I have reviewed the triage vital signs and the nursing notes.  Pertinent labs & imaging results that were available during my care of the patient were reviewed by me and considered in my medical decision making (see chart for details).    59 year old male presents with low back pain after suffering injury.  Patient has a back contusion.  X-ray obtained and independently reviewed by me today.  Interpretation: No fracture.  Mild spurring noted.  Advised rest and ice.  Meloxicam as directed.   Supportive care.  Final Clinical Impressions(s) / UC Diagnoses   Final diagnoses:  Acute bilateral low back pain without sciatica  Contusion of back, unspecified laterality, initial encounter     Discharge Instructions     Rest. Ice.  Medication as needed.  Take  care  Dr. Lacinda Axon    ED Prescriptions    Medication Sig Dispense Auth. Provider   meloxicam (MOBIC) 15 MG tablet Take 1 tablet (15 mg total) by mouth daily as needed for pain. 14 tablet Coral Spikes, DO     PDMP not reviewed this encounter.   Coral Spikes, [place] 01/27/20 1706

## 2020-01-27 NOTE — Progress Notes (Signed)
Based on what you shared with me, I feel your condition warrants further evaluation and I recommend that you be seen for a face to face office visit. I am concerned that you hurt your back after a fall and I feel that you will need an in person visit so that your back can be examined by a provider. Depending on the location of your pain, you may need an xray to rule out any fractures.   NOTE: If you entered your credit card information for this eVisit, you will not be charged. You may see a "hold" on your card for the $35 but that hold will drop off and you will not have a charge processed.   If you are having a true medical emergency please call 911.      For an urgent face to face visit, [place] has five urgent care centers for your convenience:      NEW:  [facility] [place] at [place] Get Driving Directions [id number] [place], [place] [address] . 10 am - 6pm Monday - Friday    [place] [facility]) Get Driving Directions [id number] [address] [place], [place] [address] . 10 am to 8 pm Monday-Friday . 12 pm to 8 pm [facility] at [facility] Get Driving Directions [id number] [place], [place], [place] [address] . 8 am to 8 pm Monday-Friday . 9 am to 6 pm Saturday . 11 am to 6 pm Sunday     [facility] at [facility] Get Driving Directions  [id number] [address] [place], [place] [address] . 8 am to 8 pm Monday-Friday . 8 am to 4 pm [facility] at [place] Get Driving Directions [id number] [address] [place], [place] [address] . 12 pm to 6 pm Monday-Friday      Your e-visit answers were reviewed by a board certified advanced clinical practitioner to complete your personal care plan.  Thank you for using e-Visits.   Based on what you shared with me, I  feel your condition warrants further evaluation and I recommend that you be seen for a face to face office visit.   NOTE: If you entered your credit card information for this eVisit, you will not be charged. You may see a "hold" on your card for the $35 but that hold will drop off and you will not have a charge processed.   If you are having a true medical emergency please call 911.      For an urgent face to face visit, [place] has five urgent care centers for your convenience:      NEW:  [facility] [place] at [place] Get Driving Directions [id number] [place], [place] [address] . 10 am - 6pm Monday - Friday    [place] [facility] [place] [facility]) Get Driving Directions [id number] [address] [place], [place] [address] . 10 am to 8 pm Monday-Friday . 12 pm to 8 pm [facility] at [facility] Get Driving Directions [id number] [place], [place], [place] [address] . 8 am to 8 pm Monday-Friday . 9 am to 6 pm Saturday . 11 am to 6 pm Sunday     [facility] Urgent Care at [place]  [address] [place], [place] [address] . 8 am to 8 pm Monday-Friday . 8 am to 4 pm [facility] Urgent Care at [place] Get Driving Directions [id number] [address] [place], [place] [address] . 12 pm to 6 pm Monday-Friday      Your e-visit answers were reviewed by a board certified advanced clinical practitioner to complete your personal care plan.  Thank you for using e-Visits.

## 2020-01-27 NOTE — ED Triage Notes (Signed)
Patient in today c/o back pain x 4 days. Patient states he was carrying a couch up a flight of concrete stairs and fell backwards hitting his back on the concrete steps.

## 2020-01-27 NOTE — Discharge Instructions (Signed)
Rest.  Ice.  Medication as needed.  Take care  Dr.Roshell Brigham  

## 2020-01-28 ENCOUNTER — Encounter: Payer: Self-pay | Admitting: Family Medicine

## 2020-01-28 DIAGNOSIS — Z9109 Other allergy status, other than to drugs and biological substances: Secondary | ICD-10-CM

## 2020-01-28 MED ORDER — FLUTICASONE PROPIONATE 50 MCG/ACT NA SUSP: 2.0000 | Freq: Every day | NASAL | 2 refills | Status: DC

## 2020-02-02 ENCOUNTER — Encounter: Payer: Self-pay | Admitting: Family Medicine

## 2020-02-02 ENCOUNTER — Telehealth: Payer: Self-pay

## 2020-02-02 NOTE — Telephone Encounter (Signed)
Spoke to patient about coming in to see Dr. Rosanna Randy for an appointment for next week and he says he has an upcoming appointment with him on 5/4 and he will just keep that appointment because he is not having any pain or anything today.

## 2020-02-18 NOTE — Progress Notes (Signed)
Established patient visit  I,April Miller,acting as a scribe for Wilhemena Durie, MD.,have documented all relevant documentation on the behalf of Wilhemena Durie, MD,as directed by  Wilhemena Durie, MD while in the presence of Wilhemena Durie, MD.   Patient: Darryl Larson   DOB: 11/07/1960   59 y.o. Male  MRN: [id number] Visit Date: 02/22/2020  Today's healthcare provider: Wilhemena Durie, MD   Chief Complaint  Patient presents with  . Follow-up   Subjective    HPI  Overall he is feeling well.  He would like to pursue counseling to deal with frustrating concentration issues. Borderline diabetes From 12/22/2019-A1c of 5.9 so patient is hyperglycemic but not diabetic. Advised to work on diet and exercise.  Acute/chronic prostatitis From 12/22/2019-Recheck the scopic urinalysis shows 5-10 red cells and white cells per high-power field.  Treat for prostatitis.  On next visit recheck urine.  Urology referral if the hematuria persists.  He is having no gross hematuria at all. given rx for BACTRIM DS 800-160 mg.  Adult attention deficit disorder From 12/22/2019-Has decided not to pursue this evaluation.       Medications: Outpatient Medications Prior to Visit  Medication Sig  . buPROPion (WELLBUTRIN XL) 300 MG 24 hr tablet Take 1 tablet (300 mg total) by mouth daily.  . Cinnamon 500 MG TABS Take by mouth.  . fluticasone (FLONASE) 50 MCG/ACT nasal spray Place 2 sprays into both nostrils daily.  Marland Kitchen glucose blood test strip 1 strip once a week.  . melatonin 1 MG TABS tablet Take by mouth.  . MULTIPLE VITAMIN PO Take by mouth.  . Omega-3 Fatty Acids (FISH OIL BURP-LESS) 500 MG CAPS Take 1 capsule by mouth.  . Magnesium (V-R MAGNESIUM) 250 MG TABS Take by mouth.  . meloxicam (MOBIC) 15 MG tablet Take 1 tablet (15 mg total) by mouth daily as needed for pain. (Patient not taking: Reported on 02/22/2020)  . polyethylene glycol-electrolytes (NULYTELY) 420 g solution Take  as directed for colonic prep.   No facility-administered medications prior to visit.    Review of Systems  Constitutional: Negative for appetite change, chills and fever.  Eyes: Negative.   Respiratory: Negative for chest tightness, shortness of breath and wheezing.   Cardiovascular: Negative for chest pain and palpitations.  Gastrointestinal: Negative for abdominal pain, nausea and vomiting.  Endocrine: Negative.   Genitourinary: Negative.   Allergic/Immunologic: Negative.   Neurological: Negative.   Hematological: Negative.   Psychiatric/Behavioral: Negative.        Objective    BP 116/63 (BP Location: Right Arm, Patient Position: Sitting, Cuff Size: Large)   Pulse 66   Temp (!) 97.5 F (36.4 C) (Other (Comment))   Resp 16   Ht 5\' 5"  (1.651 m)   Wt 151 lb (68.5 kg)   SpO2 96%   BMI 25.13 kg/m     Physical Exam Vitals reviewed.  Constitutional:      Appearance: Normal appearance.  HENT:     Head: Normocephalic and atraumatic.  Eyes:     General: No scleral icterus. Cardiovascular:     Rate and Rhythm: Normal rate and regular rhythm.     Pulses: Normal pulses.     Heart sounds: Normal heart sounds.  Pulmonary:     Breath sounds: Normal breath sounds.  Abdominal:     Palpations: Abdomen is soft.  Skin:    General: Skin is warm and dry.  Neurological:     General: No  focal deficit present.     Mental Status: He is alert and oriented to person, place, and time.  Psychiatric:        Mood and Affect: Mood normal.        Behavior: Behavior normal.        Thought Content: Thought content normal.        Judgment: Judgment normal.       No results found for any visits on 02/22/20.  Assessment & Plan     1. Microscopic hematuria Consistent in recent urines.  Going to refer to urology although microscopic is showing rare blood cells and he has no gross hematuria.  I think patient would feel more comfortable having this evaluated  - Ambulatory referral to  Urology 2. Adult attention deficit disorder Refer to [facility] Meiers for counseling and evaluation.  He is on Wellbutrin and stable  3. Borderline diabetes Follow-up in the fall   No follow-ups on file.      I, Wilhemena Durie, MD, have reviewed all documentation for this visit. The documentation on 02/23/20 for the exam, diagnosis, procedures, and orders are all accurate and complete.    Ivannah Zody Cranford Mon, MD  [facility] [phone] (phone) [phone] (fax)  [place]

## 2020-02-22 ENCOUNTER — Ambulatory Visit: Payer: BC Managed Care – PPO | Admitting: Family Medicine

## 2020-02-22 ENCOUNTER — Other Ambulatory Visit: Payer: Self-pay

## 2020-02-22 ENCOUNTER — Encounter: Payer: Self-pay | Admitting: Family Medicine

## 2020-02-22 VITALS — BP 116/63 | HR 66 | Temp 97.5°F | Resp 16 | Ht 65.0 in | Wt 151.0 lb

## 2020-02-22 DIAGNOSIS — R7303 Prediabetes: Secondary | ICD-10-CM

## 2020-02-22 DIAGNOSIS — F988 Other specified behavioral and emotional disorders with onset usually occurring in childhood and adolescence: Secondary | ICD-10-CM

## 2020-02-22 DIAGNOSIS — R3129 Other microscopic hematuria: Secondary | ICD-10-CM | POA: Diagnosis not present

## 2020-03-06 ENCOUNTER — Other Ambulatory Visit: Payer: Self-pay | Admitting: *Deleted

## 2020-03-06 DIAGNOSIS — R3129 Other microscopic hematuria: Secondary | ICD-10-CM

## 2020-03-07 ENCOUNTER — Other Ambulatory Visit: Payer: Self-pay

## 2020-03-07 ENCOUNTER — Other Ambulatory Visit
Admission: RE | Admit: 2020-03-07 | Discharge: 2020-03-07 | Disposition: A | Discharge status: Home / Self Care | Payer: BC Managed Care – PPO | Source: Ambulatory Visit | Attending: Urology | Admitting: Urology

## 2020-03-07 ENCOUNTER — Encounter: Payer: Self-pay | Admitting: Urology

## 2020-03-07 ENCOUNTER — Ambulatory Visit (INDEPENDENT_AMBULATORY_CARE_PROVIDER_SITE_OTHER): Payer: BC Managed Care – PPO | Admitting: Urology

## 2020-03-07 VITALS — BP 132/84 | HR 65 | Ht 65.0 in | Wt 151.0 lb

## 2020-03-07 DIAGNOSIS — R3129 Other microscopic hematuria: Secondary | ICD-10-CM | POA: Diagnosis not present

## 2020-03-07 DIAGNOSIS — R3121 Asymptomatic microscopic hematuria: Secondary | ICD-10-CM | POA: Diagnosis not present

## 2020-03-07 LAB — URINALYSIS, COMPLETE (UACMP) WITH MICROSCOPIC
Bilirubin Urine: NEGATIVE
Glucose, UA: NEGATIVE mg/dL
Ketones, ur: NEGATIVE mg/dL
Leukocytes,Ua: NEGATIVE
Nitrite: NEGATIVE
Protein, ur: NEGATIVE mg/dL
Specific Gravity, Urine: >1.03 — ABNORMAL HIGH (ref 1.005–1.030)
pH: 6 (ref 5.0–8.0)

## 2020-03-07 NOTE — Progress Notes (Signed)
   03/07/20 3:49 PM   Darryl Larson 02/05/1961 [id number]  CC: microscopic hematuria  HPI: I saw Darryl Larson in urology clinic today for evaluation of microscopic hematuria.  He is a very healthy 59 year old male with no smoking history, no prior carcinogenic exposures, and no family history of bladder or kidney cancer who was recently found to have large blood on urine dipstick.  Microscopic analysis was not performed.  He does have a history of microscopic hematuria with 11-30 RBCs in 2017, and this was evaluated by Dr. Pilar Jarvis with CT urogram and cystoscopy which were both normal.  The patient denies any gross hematuria, UTIs, or flank pain.  He has some mild urinary frequency at baseline that is minimally bothersome.  He had significant amount of discomfort and pain with the clinic cystoscopy in 2017.  Urinalysis today is pending.   PMH: Past Medical History:  Diagnosis Date  . Acid reflux   . ADD (attention deficit disorder)   . Depression     Surgical History: Past Surgical History:  Procedure Laterality Date  . VASECTOMY      Family History: Family History  Problem Relation Age of Onset  . Breast cancer Mother   . Diabetes Mother   . Pancreatic cancer Mother   . Throat cancer Father   . Alcohol abuse Father   . Depression Father   . Healthy Brother   . Healthy Daughter   . Hodgkin's lymphoma Paternal Uncle   . Depression Brother   . Depression Brother   . Cancer Paternal Aunt   . Prostate cancer Neg Hx     Social History:  reports that he has never smoked. He has quit using smokeless tobacco.  His smokeless tobacco use included chew. He reports current alcohol use. He reports that he does not use drugs.  Physical Exam: BP 132/84   Pulse 65   Ht 5\' 5"  (1.651 m)   Wt 151 lb (68.5 kg)   BMI 25.13 kg/m    Constitutional:  Alert and oriented, No acute distress. Cardiovascular: No clubbing, cyanosis, or edema. Respiratory: Normal respiratory effort, no  increased work of breathing. GI: Abdomen is soft, nontender, nondistended, no abdominal masses  Laboratory Data: Urinalysis today pending  Assessment & Plan:   In summary, is a healthy 59 year old male with no prior risk factors and no family history of bladder or kidney cancer he was found to have dipstick positive blood on recent urinalysis.  He has a history of a negative microscopic hematuria work-up with cystoscopy and CT urogram in 2017.  Urinalysis today is pending.  We discussed common possible etiologies of microscopic hematuria including idiopathic, urolithiasis, medical renal disease, and malignancy. We discussed the new asymptomatic microscopic hematuria guidelines and risk categories of low, intermediate, and high risk that are based on age, risk factors like smoking, and degree of microscopic hematuria. We discussed work-up can range from repeat urinalysis, renal ultrasound and cystoscopy, to CT urogram and cystoscopy.  Will call with urinalysis results, consider renal ultrasound if > 25 RBCs per high-power field  I spent 30 total minutes on the day of the encounter including pre-visit review of the medical record, face-to-face time with the patient, and post visit ordering of labs/imaging/tests.  Nickolas Madrid, MD 03/07/2020  [facility] [address] [place], [place] [address] [phone]

## 2020-03-08 ENCOUNTER — Telehealth: Payer: Self-pay

## 2020-03-08 NOTE — Telephone Encounter (Signed)
Called pt informed him of the information below pt gave verbal understanding, lab appt scheduled.

## 2020-03-08 NOTE — Telephone Encounter (Signed)
-----   Message from Billey Co, MD sent at 03/08/2020 11:12 AM EDT ----- Persistent 21-50 RBCs on UA, recommend voided cytology and will call with those results to look for any cancer cells. Please schedule nurse visit for voided cytology, and rtc with UA in one year, thanks  Nickolas Madrid, MD 03/08/2020

## 2020-03-09 ENCOUNTER — Other Ambulatory Visit: Payer: Self-pay

## 2020-03-09 ENCOUNTER — Other Ambulatory Visit
Admission: RE | Admit: 2020-03-09 | Discharge: 2020-03-09 | Disposition: A | Discharge status: Home / Self Care | Payer: BC Managed Care – PPO | Attending: Urology | Admitting: Urology

## 2020-03-09 ENCOUNTER — Telehealth: Payer: Self-pay

## 2020-03-09 DIAGNOSIS — R3129 Other microscopic hematuria: Secondary | ICD-10-CM

## 2020-03-09 NOTE — Telephone Encounter (Signed)
Patient called wanting to let you know that on Sunday he donated Plasma and Platelets, gave a urine sample on Tuesday and just wanted to know if that could effect his urine sample

## 2020-03-13 LAB — CYTOLOGY - NON PAP

## 2020-03-14 ENCOUNTER — Telehealth: Payer: Self-pay

## 2020-03-14 NOTE — Telephone Encounter (Signed)
-----   Message from Billey Co, MD sent at 03/14/2020  2:53 PM EDT ----- No worrisome cells on UA, please schedule follow up one year for UA, thanks  Nickolas Madrid, MD 03/14/2020

## 2020-03-14 NOTE — Telephone Encounter (Signed)
Called pt informed him of the information below. Pt gave verbal understanding.  

## 2020-04-19 NOTE — Progress Notes (Signed)
Complete physical exam  I,April Miller,acting as a scribe for Wilhemena Durie, MD.,have documented all relevant documentation on the behalf of Wilhemena Durie, MD,as directed by  Wilhemena Durie, MD while in the presence of Wilhemena Durie, MD.   Patient: Darryl Larson   DOB: October 09, 1961   59 y.o. Male  MRN: [id number] Visit Date: 05/01/2020  Today's healthcare provider: Wilhemena Durie, MD   Chief Complaint  Patient presents with  . Annual Exam   Subjective    Darryl Larson is a 59 y.o. male who presents today for a complete physical exam.  He reports consuming a general diet. The patient has a physically strenuous job, but has no regular exercise apart from work.  He generally feels fairly well. He reports sleeping fairly well. He does not have additional problems to discuss today.  Overall he is feeling better and is feeling better emotionally in recent months.  He has decided not to pursue counseling at this time.  Nor to pursue ADD work-up. HPI    Past Medical History:  Diagnosis Date  . Acid reflux   . ADD (attention deficit disorder)   . Depression    Past Surgical History:  Procedure Laterality Date  . VASECTOMY     Social History   Socioeconomic History  . Marital status: Divorced    Spouse name: Not on file  . Number of children: Not on file  . Years of education: Not on file  . Highest education level: Not on file  Occupational History  . Not on file  Tobacco Use  . Smoking status: Never Smoker  . Smokeless tobacco: Former Systems developer    Types: Secondary school teacher  . Vaping Use: Never used  Substance and Sexual Activity  . Alcohol use: Yes    Alcohol/week: 0.0 standard drinks    Comment: 3 to 4 drinks a week  . Drug use: No  . Sexual activity: Not on file  Other Topics Concern  . Not on file  Social History Narrative  . Not on file   Social Determinants of Health   Financial Resource Strain:   . Difficulty of Paying Living  Expenses:   Food Insecurity:   . Worried About Charity fundraiser in the Last Year:   . Arboriculturist in the Last Year:   Transportation Needs:   . Film/video editor (Medical):   Marland Kitchen Lack of Transportation (Non-Medical):   Physical Activity:   . Days of Exercise per Week:   . Minutes of Exercise per Session:   Stress:   . Feeling of Stress :   Social Connections:   . Frequency of Communication with Friends and Family:   . Frequency of Social Gatherings with Friends and Family:   . Attends Religious Services:   . Active Member of Clubs or Organizations:   . Attends Archivist Meetings:   Marland Kitchen Marital Status:   Intimate Partner Violence:   . Fear of Current or Ex-Partner:   . Emotionally Abused:   Marland Kitchen Physically Abused:   . Sexually Abused:    Family Status  Relation Name Status  . Mother  Deceased  . Father  Deceased at age 32  . Brother 1 Alive  . Daughter  Alive  . Annamarie Major  Deceased  . Brother 2 Alive  . Brother 3 Deceased       suicide  . Ethlyn Daniels  (Not Specified)  .  Neg Hx  (Not Specified)   Family History  Problem Relation Age of Onset  . Breast cancer Mother   . Diabetes Mother   . Pancreatic cancer Mother   . Throat cancer Father   . Alcohol abuse Father   . Depression Father   . Healthy Brother   . Healthy Daughter   . Hodgkin's lymphoma Paternal Uncle   . Depression Brother   . Depression Brother   . Cancer Paternal Aunt   . Prostate cancer Neg Hx    No Known Allergies  Patient Care Team: [name]., MD as PCP - General (Family Medicine)   Medications: Outpatient Medications Prior to Visit  Medication Sig  . buPROPion (WELLBUTRIN XL) 300 MG 24 hr tablet Take 1 tablet (300 mg total) by mouth daily.  Marland Kitchen CINNAMON PO Take by mouth daily.  . fluticasone (FLONASE) 50 MCG/ACT nasal spray SPRAY 2 SPRAYS INTO EACH NOSTRIL EVERY DAY  . glucose blood test strip 1 strip once a week.  . melatonin 1 MG TABS tablet Take by mouth.  .  MULTIPLE VITAMIN PO Take by mouth.  . Omega-3 Fatty Acids (FISH OIL BURP-LESS) 500 MG CAPS Take 1 capsule by mouth. (Patient not taking: Reported on 05/01/2020)   No facility-administered medications prior to visit.    Review of Systems  Constitutional: Negative.   HENT: Negative.   Eyes: Negative.   Respiratory: Negative.   Cardiovascular: Negative.   Gastrointestinal: Negative.   Endocrine: Negative.   Genitourinary: Negative.   Musculoskeletal: Negative.   Allergic/Immunologic: Negative.   Neurological: Negative.   Hematological: Negative.   Psychiatric/Behavioral: Negative.        Objective    BP 115/66 (BP Location: Right Arm, Patient Position: Sitting, Cuff Size: Large)   Pulse (!) 54   Temp (!) 97.3 F (36.3 C) (Other (Comment))   Resp 16   Ht 5\' 5"  (1.651 m)   Wt 154 lb (69.9 kg)   SpO2 97%   BMI 25.63 kg/m  BP Readings from Last 3 Encounters:  05/01/20 115/66  03/07/20 132/84  02/22/20 116/63   Wt Readings from Last 3 Encounters:  05/01/20 154 lb (69.9 kg)  03/07/20 151 lb (68.5 kg)  02/22/20 151 lb (68.5 kg)      Physical Exam Vitals reviewed.  Constitutional:      Appearance: Normal appearance. He is well-developed.  HENT:     Head: Normocephalic and atraumatic.     Right Ear: Tympanic membrane and external ear normal.     Left Ear: Tympanic membrane and external ear normal.     Nose: Nose normal.  Eyes:     General: No scleral icterus.    Conjunctiva/sclera: Conjunctivae normal.  Neck:     Thyroid: No thyromegaly.  Cardiovascular:     Rate and Rhythm: Normal rate and regular rhythm.     Heart sounds: Normal heart sounds.  Pulmonary:     Effort: Pulmonary effort is normal.     Breath sounds: Normal breath sounds.  Abdominal:     Palpations: Abdomen is soft.  Genitourinary:    Penis: Normal.      Testes: Normal.  Lymphadenopathy:     Cervical: No cervical adenopathy.  Skin:    General: Skin is warm and dry.  Neurological:      General: No focal deficit present.     Mental Status: He is alert and oriented to person, place, and time.  Psychiatric:  Mood and Affect: Mood normal.        Behavior: Behavior normal.        Thought Content: Thought content normal.        Judgment: Judgment normal.       Last depression screening scores PHQ 2/9 Scores 12/22/2019 04/22/2019 01/15/2018  PHQ - 2 Score 4 0 2  PHQ- 9 Score 12 - 8   Last fall risk screening Fall Risk  12/22/2019  Falls in the past year? 0  Number falls in past yr: 0  Injury with Fall? 0  Follow up Falls evaluation completed   Last Audit-C alcohol use screening Alcohol Use Disorder Test (AUDIT) 04/22/2019  1. How often do you have a drink containing alcohol? 4  2. How many drinks containing alcohol do you have on a typical day when you are drinking? 0  3. How often do you have six or more drinks on one occasion? 0  AUDIT-C Score 4  4. How often during the last year have you found that you were not able to stop drinking once you had started? 0  5. How often during the last year have you failed to do what was normally expected from you because of drinking? 0  6. How often during the last year have you needed a first drink in the morning to get yourself going after a heavy drinking session? 0  7. How often during the last year have you had a feeling of guilt of remorse after drinking? 0  8. How often during the last year have you been unable to remember what happened the night before because you had been drinking? 0  9. Have you or someone else been injured as a result of your drinking? 0  10. Has a relative or friend or a doctor or another health worker been concerned about your drinking or suggested you cut down? 0  Alcohol Use Disorder Identification Test Final Score (AUDIT) 4  Alcohol Brief Interventions/Follow-up AUDIT Score <7 follow-up not indicated   A score of 3 or more in women, and 4 or more in men indicates increased risk for alcohol abuse,  EXCEPT if all of the points are from question 1   No results found for any visits on 05/01/20.  Assessment & Plan    Routine Health Maintenance and Physical Exam  Exercise Activities and Dietary recommendations Goals   None     Immunization History  Administered Date(s) Administered  . Pneumococcal Polysaccharide-23 12/02/2012  . Tdap 11/27/2011    Health Maintenance  Topic Date Due  . HIV Screening  Never done  . INFLUENZA VACCINE  05/21/2020  . TETANUS/TDAP  11/26/2021  . COLONOSCOPY  06/04/2022  . Hepatitis C Screening  Addressed    Discussed health benefits of physical activity, and encouraged him to engage in regular exercise appropriate for his age and condition.  1. Annual physical exam  - PSA - TSH - Lipid panel - CBC with Differential/Platelet - Comprehensive metabolic panel - Hemoglobin A1c  2. Other hyperlipidemia  - TSH - Lipid panel - CBC with Differential/Platelet - Comprehensive metabolic panel - Hemoglobin A1c  3. Borderline diabetes  - TSH - Lipid panel - CBC with Differential/Platelet - Comprehensive metabolic panel - Hemoglobin A1c  4. Prostate cancer screening  - PSA - TSH - Lipid panel - CBC with Differential/Platelet - Comprehensive metabolic panel - Hemoglobin A1c   Return in 6 months (on 11/01/2020).     I, Wilhemena Durie, MD,  have reviewed all documentation for this visit. The documentation on 05/03/20 for the exam, diagnosis, procedures, and orders are all accurate and complete.    Enyla Lisbon Cranford Mon, MD  [facility] [phone] (phone) [phone] (fax)  [place]

## 2020-04-22 ENCOUNTER — Other Ambulatory Visit: Payer: Self-pay | Admitting: Family Medicine

## 2020-04-22 DIAGNOSIS — Z9109 Other allergy status, other than to drugs and biological substances: Secondary | ICD-10-CM

## 2020-04-22 NOTE — Telephone Encounter (Signed)
Requested Prescriptions  Pending Prescriptions Disp Refills  . fluticasone (FLONASE) 50 MCG/ACT nasal spray [Pharmacy Med Name: FLUTICASONE PROP 50 MCG SPRAY] 48 mL 0    Sig: SPRAY 2 SPRAYS INTO EACH NOSTRIL EVERY DAY     Ear, Nose, and Throat: Nasal Preparations - Corticosteroids Passed - 04/22/2020  9:34 AM      Passed - Valid encounter within last 12 months    Recent Outpatient Visits          2 months ago Microscopic hematuria   [facility] [name]., MD   4 months ago Borderline diabetes   [facility] [name]., MD   10 months ago Hematuria, unspecified type   [facility] [name]., MD   1 year ago Annual physical exam   [facility] [name]., MD   1 year ago Acute sinusitis, recurrence not specified, unspecified location   [facility] [name]., MD      Future Appointments            In 1 week [name]., MD [facility] [place]

## 2020-05-01 ENCOUNTER — Ambulatory Visit (INDEPENDENT_AMBULATORY_CARE_PROVIDER_SITE_OTHER): Payer: BC Managed Care – PPO | Admitting: Family Medicine

## 2020-05-01 ENCOUNTER — Encounter: Payer: Self-pay | Admitting: Family Medicine

## 2020-05-01 ENCOUNTER — Other Ambulatory Visit: Payer: Self-pay

## 2020-05-01 VITALS — BP 115/66 | HR 54 | Temp 97.3°F | Resp 16 | Ht 65.0 in | Wt 154.0 lb

## 2020-05-01 DIAGNOSIS — Z0000 Encounter for general adult medical examination without abnormal findings: Secondary | ICD-10-CM

## 2020-05-01 DIAGNOSIS — R7303 Prediabetes: Secondary | ICD-10-CM | POA: Diagnosis not present

## 2020-05-01 DIAGNOSIS — E7849 Other hyperlipidemia: Secondary | ICD-10-CM

## 2020-05-01 DIAGNOSIS — Z125 Encounter for screening for malignant neoplasm of prostate: Secondary | ICD-10-CM

## 2020-05-03 ENCOUNTER — Telehealth: Payer: Self-pay

## 2020-05-03 LAB — COMPREHENSIVE METABOLIC PANEL
ALT: 29 IU/L (ref 0–44)
AST: 22 IU/L (ref 0–40)
Albumin/Globulin Ratio: 2.3 — ABNORMAL HIGH (ref 1.2–2.2)
Albumin: 4.4 g/dL (ref 3.8–4.9)
Alkaline Phosphatase: 56 IU/L (ref 48–121)
BUN/Creatinine Ratio: 19 (ref 9–20)
BUN: 16 mg/dL (ref 6–24)
Bilirubin Total: 0.4 mg/dL (ref 0.0–1.2)
CO2: 24 mmol/L (ref 20–29)
Calcium: 9.2 mg/dL (ref 8.7–10.2)
Chloride: 102 mmol/L (ref 96–106)
Creatinine, Ser: 0.86 mg/dL (ref 0.76–1.27)
GFR calc Af Amer: 110 mL/min/{1.73_m2} (ref >59)
GFR calc non Af Amer: 95 mL/min/{1.73_m2} (ref >59)
Globulin, Total: 1.9 g/dL (ref 1.5–4.5)
Glucose: 136 mg/dL — ABNORMAL HIGH (ref 65–99)
Potassium: 4.3 mmol/L (ref 3.5–5.2)
Sodium: 138 mmol/L (ref 134–144)
Total Protein: 6.3 g/dL (ref 6.0–8.5)

## 2020-05-03 LAB — CBC WITH DIFFERENTIAL/PLATELET
Basophils Absolute: 0.1 10*3/uL (ref 0.0–0.2)
Basos: 1 %
EOS (ABSOLUTE): 0.1 10*3/uL (ref 0.0–0.4)
Eos: 2 %
Hematocrit: 46.6 % (ref 37.5–51.0)
Hemoglobin: 15.7 g/dL (ref 13.0–17.7)
Immature Grans (Abs): 0 10*3/uL (ref 0.0–0.1)
Immature Granulocytes: 0 %
Lymphocytes Absolute: 1.2 10*3/uL (ref 0.7–3.1)
Lymphs: 27 %
MCH: 29.8 pg (ref 26.6–33.0)
MCHC: 33.7 g/dL (ref 31.5–35.7)
MCV: 88 fL (ref 79–97)
Monocytes Absolute: 0.5 10*3/uL (ref 0.1–0.9)
Monocytes: 10 %
Neutrophils Absolute: 2.6 10*3/uL (ref 1.4–7.0)
Neutrophils: 60 %
Platelets: 157 10*3/uL (ref 150–450)
RBC: 5.27 x10E6/uL (ref 4.14–5.80)
RDW: 11.8 % (ref 11.6–15.4)
WBC: 4.4 10*3/uL (ref 3.4–10.8)

## 2020-05-03 LAB — LIPID PANEL
Chol/HDL Ratio: 4.1 ratio (ref 0.0–5.0)
Cholesterol, Total: 219 mg/dL — ABNORMAL HIGH (ref 100–199)
HDL: 53 mg/dL (ref >39)
LDL Chol Calc (NIH): 151 mg/dL — ABNORMAL HIGH (ref 0–99)
Triglycerides: 83 mg/dL (ref 0–149)
VLDL Cholesterol Cal: 15 mg/dL (ref 5–40)

## 2020-05-03 LAB — PSA: Prostate Specific Ag, Serum: 0.6 ng/mL (ref 0.0–4.0)

## 2020-05-03 LAB — TSH: TSH: 0.79 u[IU]/mL (ref 0.450–4.500)

## 2020-05-03 LAB — HEMOGLOBIN A1C
Est. average glucose Bld gHb Est-mCnc: 123 mg/dL
Hgb A1c MFr Bld: 5.9 % — ABNORMAL HIGH (ref 4.8–5.6)

## 2020-05-03 NOTE — Telephone Encounter (Signed)
-----   Message from [name]., MD sent at 05/03/2020  4:05 PM EDT ----- Labs stable.

## 2020-05-03 NOTE — Telephone Encounter (Signed)
Patient given lab results through mychart and has read the providers comments.  

## 2020-05-11 ENCOUNTER — Other Ambulatory Visit: Payer: Self-pay | Admitting: Family Medicine

## 2020-05-11 DIAGNOSIS — Z9109 Other allergy status, other than to drugs and biological substances: Secondary | ICD-10-CM

## 2020-06-12 ENCOUNTER — Encounter: Payer: Self-pay | Admitting: Family Medicine

## 2020-09-15 ENCOUNTER — Other Ambulatory Visit: Payer: Self-pay | Admitting: Family Medicine

## 2020-11-01 ENCOUNTER — Other Ambulatory Visit: Payer: Self-pay

## 2020-11-01 ENCOUNTER — Encounter: Payer: Self-pay | Admitting: Family Medicine

## 2020-11-01 ENCOUNTER — Ambulatory Visit (INDEPENDENT_AMBULATORY_CARE_PROVIDER_SITE_OTHER): Payer: Self-pay | Admitting: Family Medicine

## 2020-11-01 VITALS — BP 132/88 | HR 58 | Temp 97.9°F | Resp 16 | Wt 161.0 lb

## 2020-11-01 DIAGNOSIS — F3289 Other specified depressive episodes: Secondary | ICD-10-CM | POA: Diagnosis not present

## 2020-11-01 DIAGNOSIS — E7849 Other hyperlipidemia: Secondary | ICD-10-CM

## 2020-11-01 DIAGNOSIS — R7303 Prediabetes: Secondary | ICD-10-CM

## 2020-11-01 DIAGNOSIS — K219 Gastro-esophageal reflux disease without esophagitis: Secondary | ICD-10-CM

## 2020-11-01 DIAGNOSIS — M545 Low back pain, unspecified: Secondary | ICD-10-CM

## 2020-11-01 DIAGNOSIS — G8929 Other chronic pain: Secondary | ICD-10-CM

## 2020-11-01 NOTE — Progress Notes (Signed)
Established patient visit   Patient: Darryl Larson   DOB: 12/20/1960   60 y.o. Male  MRN: [id number] Visit Date: 11/01/2020  Today's healthcare provider: Wilhemena Durie, MD   Chief Complaint  Patient presents with   Hyperglycemia   Hyperlipidemia   Subjective    HPI  Patient in today for follow-up. He had significant myalgias with statins in the past so is statin intolerant. Does have continued problems with right low back pain without significant sciatica. Prediabetes, Follow-up  Lab Results  Component Value Date   HGBA1C 5.9 (H) 05/02/2020   HGBA1C 5.9 (A) 12/22/2019   HGBA1C 6.1 (H) 04/26/2019   GLUCOSE 136 (H) 05/02/2020   GLUCOSE 121 (H) 04/26/2019   GLUCOSE 105 (H) 01/15/2018    Last seen for for this8 months ago.  Management since that visit includes none. Current symptoms include visual disturbances and have been unchanged.  Patient reports that his last eye exam was 09/2019 and reports cataracts left eye.  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   Pertinent Labs:    Component Value Date/Time   CHOL 219 (H) 05/02/2020 0848   TRIG 83 05/02/2020 0848   CHOLHDL 4.1 05/02/2020 0848   CREATININE 0.86 05/02/2020 0848    Wt Readings from Last 3 Encounters:  11/01/20 161 lb (73 kg)  05/01/20 154 lb (69.9 kg)  03/07/20 151 lb (68.5 kg)    ----------------------------------------------------------------------------------------- Lipid/Cholesterol, Follow-up  Last lipid panel Other pertinent labs  Lab Results  Component Value Date   CHOL 219 (H) 05/02/2020   HDL 53 05/02/2020   LDLCALC 151 (H) 05/02/2020   TRIG 83 05/02/2020   CHOLHDL 4.1 05/02/2020   Lab Results  Component Value Date   ALT 29 05/02/2020   AST 22 05/02/2020   PLT 157 05/02/2020   TSH 0.790 05/02/2020     He  was last seen for this 8 months ago.  Management since that visit includes none.  He reports excellent compliance with treatment. He is not having side effects.    Symptoms: No chest pain No chest pressure/discomfort  No dyspnea No lower extremity edema  No numbness or tingling of extremity No orthopnea  No palpitations No paroxysmal nocturnal dyspnea  No speech difficulty No syncope   Current diet: in general, an "unhealthy" diet Current exercise: no regular exercise  The 10-year ASCVD risk score Mikey Bussing DC Jr., et al., 2013) is: 8.7%  ---------------------------------------------------------------------------------------------------  Patient Active Problem List   Diagnosis Date Noted   Adult attention deficit disorder 07/18/2015   Clinical depression 07/18/2015   Gastro-esophageal reflux disease without esophagitis 07/18/2015   Borderline diabetes 07/18/2015   Blood glucose elevated 07/18/2015   HLD (hyperlipidemia) 07/18/2015   Impingement syndrome of shoulder 07/18/2015   Lipoma of shoulder 07/18/2015   Past Medical History:  Diagnosis Date   Acid reflux    ADD (attention deficit disorder)    Depression    No Known Allergies     Medications: Outpatient Medications Prior to Visit  Medication Sig   buPROPion (WELLBUTRIN XL) 300 MG 24 hr tablet TAKE 1 TABLET BY MOUTH EVERY DAY   CINNAMON PO Take by mouth daily.   fluticasone (FLONASE) 50 MCG/ACT nasal spray SPRAY 2 SPRAYS INTO EACH NOSTRIL EVERY DAY   glucose blood test strip 1 strip once a week.  melatonin 1 MG TABS tablet Take by mouth.   MULTIPLE VITAMIN PO Take by mouth.   Omega-3 Fatty Acids (FISH OIL BURP-LESS) 500 MG CAPS Take 1 capsule by mouth. (Patient not taking: Reported on 05/01/2020)   No facility-administered medications prior to visit.    Review of Systems  Last lipids Lab Results  Component Value Date   CHOL 240 (H) 11/02/2020   HDL 51 11/02/2020   LDLCALC 178 (H)  11/02/2020   TRIG 68 11/02/2020   CHOLHDL 4.7 11/02/2020       Objective    BP 132/88    Pulse (!) 58    Temp 97.9 F (36.6 C) (Oral)    Resp 16    Wt 161 lb (73 kg)    BMI 26.79 kg/m  BP Readings from Last 3 Encounters:  11/01/20 132/88  05/01/20 115/66  03/07/20 132/84   Wt Readings from Last 3 Encounters:  11/01/20 161 lb (73 kg)  05/01/20 154 lb (69.9 kg)  03/07/20 151 lb (68.5 kg)      Physical Exam Vitals reviewed.  Constitutional:      Appearance: Normal appearance.  HENT:     Head: Normocephalic and atraumatic.  Eyes:     General: No scleral icterus. Cardiovascular:     Rate and Rhythm: Normal rate and regular rhythm.     Pulses: Normal pulses.     Heart sounds: Normal heart sounds.  Pulmonary:     Breath sounds: Normal breath sounds.  Abdominal:     Palpations: Abdomen is soft.  Neurological:     General: No focal deficit present.     Mental Status: He is alert and oriented to person, place, and time.     Motor: No weakness.  Psychiatric:        Mood and Affect: Mood normal.        Behavior: Behavior normal.        Thought Content: Thought content normal.        Judgment: Judgment normal.       No results found for any visits on 11/01/20.  Assessment & Plan     1. Borderline diabetes  - Hemoglobin A1c  2. Other hyperlipidemia Patient is statin intolerant with myalgias in the past.  Consider Zetia - Lipid panel  3. Gastro-esophageal reflux disease without esophagitis   4. Other depression Continue Wellbutrin  5. Chronic right-sided low back pain without sciatica Continue self-care and possible chiropractic care.   No follow-ups on file.      I, Wilhemena Durie, MD, have reviewed all documentation for this visit. The documentation on 11/10/20 for the exam, diagnosis, procedures, and orders are all accurate and complete.    Ishaaq Penna Cranford Mon, MD  [facility] [phone] (phone) [phone] (fax)  [place]

## 2020-11-01 NOTE — Patient Instructions (Signed)
Diabetes Care, 44(Suppl 1), D40-C14. https://doi.org/https://doi.org/10.2337/dc21-S003">  Prediabetes Prediabetes is when your blood sugar (blood glucose) level is higher than normal but not high enough for you to be diagnosed with type 2 diabetes. Having prediabetes puts you at risk for developing type 2 diabetes (type 2 diabetes mellitus). With certain lifestyle changes, you may be able to prevent or delay the onset of type 2 diabetes. This is important because type 2 diabetes can lead to serious complications, such as:  Heart disease.  Stroke.  Blindness.  Kidney disease.  Depression.  Poor circulation in the feet and legs. In severe cases, this could lead to surgical removal of a leg (amputation). What are the causes? The exact cause of prediabetes is not known. It may result from insulin resistance. Insulin resistance develops when cells in the body do not respond properly to insulin that the body makes. This can cause excess glucose to build up in the blood. High blood glucose (hyperglycemia) can develop. What increases the risk? The following factors may make you more likely to develop this condition:  You have a family member with type 2 diabetes.  You are older than 45 years.  You had a temporary form of diabetes during a pregnancy (gestational diabetes).  You had polycystic ovary syndrome (PCOS).  You are overweight or obese.  You are inactive (sedentary).  You have a history of heart disease, including problems with cholesterol levels, high levels of blood fats, or high blood pressure. What are the signs or symptoms? You may have no symptoms. If you do have symptoms, they may include:  Increased hunger.  Increased thirst.  Increased urination.  Vision changes, such as blurry vision.  Tiredness (fatigue). How is this diagnosed? This condition can be diagnosed with blood tests. Your blood glucose may be checked with one or more of the following tests:  A  fasting blood glucose (FBG) test. You will not be allowed to eat (you will fast) for at least 8 hours before a blood sample is taken.  An A1C blood test (hemoglobin A1C). This test provides information about blood glucose levels over the previous 2?3 months.  An oral glucose tolerance test (OGTT). This test measures your blood glucose at two points in time: ? After fasting. This is your baseline level. ? Two hours after you drink a beverage that contains glucose. You may be diagnosed with prediabetes if:  Your FBG is 100?125 mg/dL (5.6-6.9 mmol/L).  Your A1C level is 5.7?6.4% (39-46 mmol/mol).  Your OGTT result is 140?199 mg/dL (7.8-11 mmol/L). These blood tests may be repeated to confirm your diagnosis.   How is this treated? Treatment may include dietary and lifestyle changes to help lower your blood glucose and prevent type 2 diabetes from developing. In some cases, medicine may be prescribed to help lower the risk of type 2 diabetes. Follow these instructions at home: Nutrition  Follow a healthy meal plan. This includes eating lean proteins, whole grains, legumes, fresh fruits and vegetables, low-fat dairy products, and healthy fats.  Follow instructions from your health care provider about eating or drinking restrictions.  Meet with a dietitian to create a healthy eating plan that is right for you.   Lifestyle  Do moderate-intensity exercise for at least 30 minutes a day on 5 or more days each week, or as told by your health care provider. A mix of activities may be best, such as: ? Brisk walking, swimming, biking, and weight lifting.  Lose weight as told by your health  care provider. Losing 5-7% of your body weight can reverse insulin resistance.  Do not drink alcohol if: ? Your health care provider tells you not to drink. ? You are pregnant, may be pregnant, or are planning to become pregnant.  If you drink alcohol: ? Limit how much you use to:  0-1 drink a day for  women.  0-2 drinks a day for men. ? Be aware of how much alcohol is in your drink. In the [place] one drink equals one 12 oz bottle of beer (355 mL), one 5 oz glass of wine (148 mL), or one 1 oz glass of hard liquor (44 mL). General instructions  Take over-the-counter and prescription medicines only as told by your health care provider. You may be prescribed medicines that help lower the risk of type 2 diabetes.  Do not use any products that contain nicotine or tobacco, such as cigarettes, e-cigarettes, and chewing tobacco. If you need help quitting, ask your health care provider.  Keep all follow-up visits. This is important. Where to find more information  American Diabetes Association: www.diabetes.org  Academy of Nutrition and Dietetics: www.eatright.org  American Heart Association: www.heart.org Contact a health care provider if:  You have any of these symptoms: ? Increased hunger. ? Increased urination. ? Increased thirst. ? Fatigue. ? Vision changes, such as blurry vision. Get help right away if you:  Have shortness of breath.  Feel confused.  Vomit or feel like you may vomit. Summary  Prediabetes is when your blood sugar (blood glucose)level is higher than normal but not high enough for you to be diagnosed with type 2 diabetes.  Having prediabetes puts you at risk for developing type 2 diabetes (type 2 diabetes mellitus).  Make lifestyle changes such as eating a healthy diet and exercising regularly to help prevent diabetes. Lose weight as told by your health care provider. This information is not intended to replace advice given to you by your health care provider. Make sure you discuss any questions you have with your health care provider. Document Revised: 01/06/2020 Document Reviewed: 01/06/2020 Elsevier Patient Education  2021 Elsevier Inc.  

## 2020-11-03 LAB — LIPID PANEL
Chol/HDL Ratio: 4.7 ratio (ref 0.0–5.0)
Cholesterol, Total: 240 mg/dL — ABNORMAL HIGH (ref 100–199)
HDL: 51 mg/dL (ref >39)
LDL Chol Calc (NIH): 178 mg/dL — ABNORMAL HIGH (ref 0–99)
Triglycerides: 68 mg/dL (ref 0–149)
VLDL Cholesterol Cal: 11 mg/dL (ref 5–40)

## 2020-11-03 LAB — HEMOGLOBIN A1C
Est. average glucose Bld gHb Est-mCnc: 131 mg/dL
Hgb A1c MFr Bld: 6.2 % — ABNORMAL HIGH (ref 4.8–5.6)

## 2020-11-06 ENCOUNTER — Telehealth: Payer: Self-pay

## 2020-11-06 ENCOUNTER — Encounter: Payer: Self-pay | Admitting: Family Medicine

## 2020-11-06 DIAGNOSIS — E7849 Other hyperlipidemia: Secondary | ICD-10-CM

## 2020-11-06 NOTE — Telephone Encounter (Signed)
Copied from [place] [id number]. Topic: General - Inquiry >> Nov 06, 2020  9:56 AM Greggory Keen D wrote: Pt called saying he noticed that his chol thi was up to 240 this time when he had his labs done.  He would like to have a nurse call him back.  CB#  [phone]

## 2020-11-07 NOTE — Telephone Encounter (Signed)
Patient is requesting to restart pravastatin half the dose or wanting to know if you have any other suggestion for medication. Please advise. Patient reports pravastatin in the past gave him muscle pain.

## 2020-11-08 MED ORDER — ROSUVASTATIN CALCIUM 5 MG PO TABS: 5.0000 mg | ORAL_TABLET | Freq: Every day | ORAL | 3 refills | Status: DC

## 2020-11-08 NOTE — Telephone Encounter (Signed)
Advised patient. Medication sent into the pharmacy.

## 2020-11-08 NOTE — Telephone Encounter (Signed)
-----   Message from [name]., MD sent at 11/08/2020  1:26 PM EST ----- Sugar okay but cholesterol a bit higher.  Lets try low-dose rosuvastatin 5 mg daily and follow-up in a month or so.  He is unlikely to have side effects with this medicine at this dose.

## 2020-11-19 ENCOUNTER — Encounter: Payer: Self-pay | Admitting: Family Medicine

## 2020-11-20 ENCOUNTER — Other Ambulatory Visit: Payer: Self-pay | Admitting: *Deleted

## 2020-11-20 DIAGNOSIS — R7303 Prediabetes: Secondary | ICD-10-CM

## 2020-11-20 MED ORDER — GLUCOSE BLOOD VI STRP: ORAL_STRIP | 12 refills | Status: DC

## 2020-11-20 MED ORDER — GLUCOSE BLOOD VI STRP: ORAL_STRIP | 12 refills | Status: AC

## 2020-12-07 ENCOUNTER — Other Ambulatory Visit: Payer: Self-pay | Admitting: Family Medicine

## 2020-12-07 ENCOUNTER — Encounter: Payer: Self-pay | Admitting: Family Medicine

## 2020-12-07 DIAGNOSIS — Z9109 Other allergy status, other than to drugs and biological substances: Secondary | ICD-10-CM

## 2020-12-07 MED ORDER — FLUTICASONE PROPIONATE 50 MCG/ACT NA SUSP: 2.0000 | Freq: Every day | NASAL | 12 refills | Status: DC

## 2020-12-10 ENCOUNTER — Other Ambulatory Visit: Payer: Self-pay | Admitting: Family Medicine

## 2020-12-12 NOTE — Telephone Encounter (Signed)
Appt scheduled

## 2020-12-28 ENCOUNTER — Encounter: Payer: Self-pay | Admitting: Family Medicine

## 2020-12-29 ENCOUNTER — Other Ambulatory Visit: Payer: Self-pay | Admitting: *Deleted

## 2020-12-29 DIAGNOSIS — M25512 Pain in left shoulder: Secondary | ICD-10-CM

## 2021-01-09 ENCOUNTER — Other Ambulatory Visit: Payer: Self-pay

## 2021-01-09 ENCOUNTER — Encounter: Payer: Self-pay | Admitting: Family Medicine

## 2021-01-09 ENCOUNTER — Ambulatory Visit: Payer: BC Managed Care – PPO | Admitting: Family Medicine

## 2021-01-09 VITALS — BP 142/79 | HR 61 | Temp 98.3°F | Resp 16 | Wt 160.0 lb

## 2021-01-09 DIAGNOSIS — G479 Sleep disorder, unspecified: Secondary | ICD-10-CM

## 2021-01-09 DIAGNOSIS — E7849 Other hyperlipidemia: Secondary | ICD-10-CM | POA: Diagnosis not present

## 2021-01-09 DIAGNOSIS — R7303 Prediabetes: Secondary | ICD-10-CM | POA: Diagnosis not present

## 2021-01-09 NOTE — Progress Notes (Signed)
Established patient visit   Patient: Darryl Larson   DOB: 04-25-1961   60 y.o. Male  MRN: [id number] Visit Date: 01/09/2021  Today's healthcare provider: Wilhemena Durie, MD   Chief Complaint  Patient presents with  . Hyperlipidemia   Subjective    HPI  Patient comes in today for follow-up.  He is tolerating rosuvastatin well. 1 complaint today is 1 of sleep disturbance.  He requests a sleep study but he is describing frequent awakenings and movement issues that affect his sleep.  He is not describing any apneic spells today. Lipid/Cholesterol, follow-up  Last Lipid Panel: Lab Results  Component Value Date   CHOL 240 (H) 11/02/2020   LDLCALC 178 (H) 11/02/2020   HDL 51 11/02/2020   TRIG 68 11/02/2020    He was last seen for this 2 months ago.  Management since that visit includes; started rosuvastatin 5 mg qd.  He reports good compliance with treatment. He is not having side effects.   Symptoms: No appetite changes No foot ulcerations  No chest pain No chest pressure/discomfort  No dyspnea No orthopnea  No fatigue No lower extremity edema  No palpitations No paroxysmal nocturnal dyspnea  No nausea No numbness or tingling of extremity  No polydipsia No polyuria  No speech difficulty No syncope   He is following a Regular diet. Current exercise: no regular exercise, but he does stay active.   Last metabolic panel Lab Results  Component Value Date   GLUCOSE 136 (H) 05/02/2020   NA 138 05/02/2020   K 4.3 05/02/2020   BUN 16 05/02/2020   CREATININE 0.86 05/02/2020   GFRNONAA 95 05/02/2020   GFRAA 110 05/02/2020   CALCIUM 9.2 05/02/2020   AST 22 05/02/2020   ALT 29 05/02/2020   The 10-year ASCVD risk score Mikey Bussing DC Jr., et al., 2013) is: 11.9%       Medications: Outpatient Medications Prior to Visit  Medication Sig  . buPROPion (WELLBUTRIN XL) 300 MG 24 hr tablet TAKE 1 TABLET BY MOUTH EVERY DAY  . CINNAMON PO Take by mouth daily.  .  fluticasone (FLONASE) 50 MCG/ACT nasal spray Place 2 sprays into both nostrils daily.  Marland Kitchen glucose blood test strip USE TO TEST BLOOD SUGAR LEVELS ONE TO TWO TIMES A WEEK  . melatonin 1 MG TABS tablet Take by mouth.  . MULTIPLE VITAMIN PO Take by mouth.  . Red Yeast Rice Extract (RED YEAST RICE PO) Take by mouth.  . rosuvastatin (CRESTOR) 5 MG tablet Take 1 tablet (5 mg total) by mouth daily.  . Omega-3 Fatty Acids (FISH OIL BURP-LESS) 500 MG CAPS Take 1 capsule by mouth.   No facility-administered medications prior to visit.    Review of Systems  Constitutional: Negative for appetite change, chills and fever.  Respiratory: Negative for chest tightness, shortness of breath and wheezing.   Cardiovascular: Negative for chest pain and palpitations.  Gastrointestinal: Negative for abdominal pain, nausea and vomiting.        Objective    BP (!) 142/79   Pulse 61   Temp 98.3 F (36.8 C)   Resp 16   Wt 160 lb (72.6 kg)   BMI 26.63 kg/m  BP Readings from Last 3 Encounters:  01/09/21 (!) 142/79  11/01/20 132/88  05/01/20 115/66   Wt Readings from Last 3 Encounters:  01/09/21 160 lb (72.6 kg)  11/01/20 161 lb (73 kg)  05/01/20 154 lb (69.9 kg)  Physical Exam Vitals reviewed.  Constitutional:      Appearance: Normal appearance.  HENT:     Head: Normocephalic and atraumatic.  Eyes:     General: No scleral icterus. Cardiovascular:     Rate and Rhythm: Normal rate and regular rhythm.     Pulses: Normal pulses.     Heart sounds: Normal heart sounds.  Pulmonary:     Breath sounds: Normal breath sounds.  Abdominal:     Palpations: Abdomen is soft.  Neurological:     General: No focal deficit present.     Mental Status: He is alert and oriented to person, place, and time.     Motor: No weakness.  Psychiatric:        Mood and Affect: Mood normal.        Behavior: Behavior normal.        Thought Content: Thought content normal.        Judgment: Judgment normal.        No results found for any visits on 01/09/21.  Assessment & Plan     1. Other hyperlipidemia Now on rosuvastatin.  Would like to see goal LDL less than 70 - Lipid panel - Hepatic function panel  2. Borderline diabetes Good control  3. Sleep disturbance Refer to neurology/sleep medicine.  He is not describing simple sleep apnea. - Ambulatory referral to Neurology   No follow-ups on file.      I, Wilhemena Durie, MD, have reviewed all documentation for this visit. The documentation on 01/10/21 for the exam, diagnosis, procedures, and orders are all accurate and complete.    Marvette Schamp Cranford Mon, MD  [facility] [phone] (phone) [phone] (fax)  [place]

## 2021-01-12 LAB — LIPID PANEL
Chol/HDL Ratio: 3 ratio (ref 0.0–5.0)
Cholesterol, Total: 150 mg/dL (ref 100–199)
HDL: 50 mg/dL (ref >39)
LDL Chol Calc (NIH): 89 mg/dL (ref 0–99)
Triglycerides: 49 mg/dL (ref 0–149)
VLDL Cholesterol Cal: 11 mg/dL (ref 5–40)

## 2021-01-12 LAB — HEPATIC FUNCTION PANEL
ALT: 47 IU/L — ABNORMAL HIGH (ref 0–44)
AST: 28 IU/L (ref 0–40)
Albumin: 4.5 g/dL (ref 3.8–4.9)
Alkaline Phosphatase: 63 IU/L (ref 44–121)
Bilirubin Total: 0.4 mg/dL (ref 0.0–1.2)
Bilirubin, Direct: 0.13 mg/dL (ref 0.00–0.40)
Total Protein: 6.7 g/dL (ref 6.0–8.5)

## 2021-01-16 ENCOUNTER — Encounter: Payer: Self-pay | Admitting: Family Medicine

## 2021-03-06 ENCOUNTER — Institutional Professional Consult (permissible substitution): Payer: Self-pay | Admitting: Neurology

## 2021-03-09 ENCOUNTER — Other Ambulatory Visit: Payer: Self-pay | Admitting: Family Medicine

## 2021-03-09 NOTE — Telephone Encounter (Signed)
Requested Prescriptions  Pending Prescriptions Disp Refills  . buPROPion (WELLBUTRIN XL) 300 MG 24 hr tablet [Pharmacy Med Name: BUPROPION HCL XL 300 MG TABLET] 90 tablet 0    Sig: TAKE 1 TABLET BY MOUTH EVERY DAY     Psychiatry: Antidepressants - bupropion Failed - 03/09/2021  1:28 AM      Failed - Completed PHQ-2 or PHQ-9 in the last 360 days      Failed - Last BP in normal range    BP Readings from Last 1 Encounters:  01/09/21 (!) 142/79         Passed - Valid encounter within last 6 months    Recent Outpatient Visits          1 month ago Other hyperlipidemia   [facility] [name]., MD   4 months ago Borderline diabetes   [facility] [name]., MD   10 months ago Annual physical exam   [facility] [name]., MD   1 year ago Microscopic hematuria   [facility] [name]., MD   1 year ago Borderline diabetes   [facility] [name]., MD      Future Appointments            In 2 months [name]., MD [facility], [place]   In 2 months [name]., MD [facility], [place]

## 2021-03-14 ENCOUNTER — Other Ambulatory Visit: Payer: Self-pay

## 2021-03-14 ENCOUNTER — Encounter: Payer: Self-pay | Admitting: Urology

## 2021-03-14 ENCOUNTER — Ambulatory Visit: Payer: BC Managed Care – PPO | Admitting: Urology

## 2021-03-14 VITALS — BP 114/65 | HR 62 | Ht 65.0 in | Wt 154.0 lb

## 2021-03-14 DIAGNOSIS — R3129 Other microscopic hematuria: Secondary | ICD-10-CM

## 2021-03-14 NOTE — Progress Notes (Signed)
   03/14/2021 3:45 PM   Darryl Larson 20-Nov-1960 [id number]  Reason for visit: Follow up asymptomatic microscopic hematuria  HPI: Briefly, very healthy 60 year old male with no smoking history and no carcinogenic exposures, no family history of bladder or kidney cancer who has had a long history of microscopic hematuria and dipstick positive blood on urine samples.  He had 11-30 RBCs in 2017 and underwent a CT urogram and cystoscopy with Dr. Pilar Jarvis that was normal.  He has never had gross hematuria, UTIs, or flank pain.  He had a significant mount of pain with a cystoscopy at that time.  Our last visit he had persistent microscopic hematuria with 21-50 RBCs and using shared decision making we opted for a voided cytology, and this was benign.  Urinalysis today is still pending.  We discussed options at length including repeat imaging with either CT, cystoscopy, or cystoscopy, versus surveillance.  With the duration of his microscopic hematuria this is likely a benign process, but we cannot completely rule out other causes.  He would like to opt for surveillance at this time which I think is reasonable.  We discussed return precautions extensively including gross hematuria, flank pain, recurrent UTIs.  He was amenable to considering repeat imaging with a cystoscopy or CT if he had a significant increase in his microscopic hematuria with > 50 RBCs.  Call with urinalysis results-> consider renal ultrasound if > 50 RBCs  Billey Co, [place] [address] [place], [place] [address] [phone]

## 2021-03-15 ENCOUNTER — Telehealth: Payer: Self-pay

## 2021-03-15 DIAGNOSIS — R399 Unspecified symptoms and signs involving the genitourinary system: Secondary | ICD-10-CM

## 2021-03-15 DIAGNOSIS — R3129 Other microscopic hematuria: Secondary | ICD-10-CM

## 2021-03-15 LAB — URINALYSIS, COMPLETE
Bilirubin, UA: NEGATIVE
Glucose, UA: NEGATIVE
Ketones, UA: NEGATIVE
Nitrite, UA: NEGATIVE
Protein,UA: NEGATIVE
Specific Gravity, UA: 1.02 (ref 1.005–1.030)
Urobilinogen, Ur: 0.2 mg/dL (ref 0.2–1.0)
pH, UA: 7 (ref 5.0–7.5)

## 2021-03-15 LAB — MICROSCOPIC EXAMINATION

## 2021-03-15 MED ORDER — SULFAMETHOXAZOLE-TRIMETHOPRIM 800-160 MG PO TABS: 1.0000 | ORAL_TABLET | Freq: Two times a day (BID) | ORAL | 0 refills | Status: AC

## 2021-03-15 NOTE — Telephone Encounter (Signed)
-----   Message from Billey Co, MD sent at 03/15/2021  4:41 PM EDT ----- There was less microscopic blood on his urine sample, but looks like a UTI.  Please do Bactrim DS twice daily x5 days, and I would also recommend a renal ultrasound in 1 month just to rule out any other reason for the microscopic blood and infection, can call with those results  Nickolas Madrid, MD 03/15/2021

## 2021-03-15 NOTE — Telephone Encounter (Signed)
Called pt informed him of the information below. Pt gave verbal understanding. RX senr. Renal U/S ordered.

## 2021-04-05 ENCOUNTER — Ambulatory Visit: Payer: BC Managed Care – PPO | Admitting: Neurology

## 2021-04-05 ENCOUNTER — Encounter: Payer: Self-pay | Admitting: Neurology

## 2021-04-05 VITALS — BP 128/76 | HR 75 | Ht 65.0 in | Wt 153.0 lb

## 2021-04-05 DIAGNOSIS — R442 Other hallucinations: Secondary | ICD-10-CM

## 2021-04-05 DIAGNOSIS — R0689 Other abnormalities of breathing: Secondary | ICD-10-CM

## 2021-04-05 DIAGNOSIS — G4763 Sleep related bruxism: Secondary | ICD-10-CM | POA: Diagnosis not present

## 2021-04-05 DIAGNOSIS — G4719 Other hypersomnia: Secondary | ICD-10-CM

## 2021-04-05 NOTE — Progress Notes (Signed)
SLEEP MEDICINE CLINIC    Provider:  Larey Seat, MD  Primary Care Physician:  [name]., MD [address] [place] [address]     Referring Provider: [name]., Md [address] [place],  [place] [address]          Chief Complaint according to patient   Patient presents with:     New Patient (Initial Visit)           HISTORY OF PRESENT ILLNESS:  Darryl Larson is a 61 y.o. year old  Caucasian male patient seen here upon referral on 04/05/2021 from Dr. Rosanna Randy,  for a sleep consultation.  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  His primary care physician did not specify what his sleep disturbances.  While his sleep disturbance is that he is excessively daytime sleepy he endorsed the Epworth Sleepiness Scale at 19 points the fatigue severity only at 37 points which is around medium range.  He does not feel excessively depressed or fatigued but he can go to sleep with little provocation.  He also describes that his friends seem never to eat as much sleep as he does.  Reading is especially dangerous to provoke sleeping but just being an active resting anywhere without being physically active or mentally stimulated is almost a guarantee for him to fall asleep.  He does not think that his nocturnal sleep is poor. He does not remember that he dreams at night or if he dreams at night and he does not have a frequent bathroom breaks -he just wakes up spontaneously.   Darryl Larson  has a past medical history of Acid reflux, ADD (attention deficit disorder), HYPERLIPIDEMIA and Depression. He cn be tired all day long and still has some days problems to go to sleep. Averages 5-6 hours of sleep.  Bedtime  between 10.30- 6 AM.      Sleep relevant medical history:  Family  history:  no hypersomnia.   Social history:  Patient is retired  from [employer], [employer], middle school.   and lives in a household alone, one dog and one cat . Adult children.  Tobacco use; former chewing .   ETOH use 2 light beers some nights. , Caffeine intake in form of Coffee( in AM ) Soda( 1 every other day) . Regular exercise in form of PE, physical labour.        Sleep habits are as follows: The patient's dinner time is between 7-8 PM. The patient goes to bed at 10-11 PM and continues to sleep for intervals of 1-2 hours, wakes for unknown reasons- 4-5 times -  The preferred sleep position is sides , with the support of 1 memory-contour  pillow.  Dreams are reportedly rare.   AM is the usual rise time.  The patient wakes up with an alarm.  He reports not feeling refreshed or restored in AM. Naps are taken frequently, lasting from 15-30  minutes and are more refreshing  for at least 2 hours.  Review of Systems: Out of a complete 14 system review, the patient complains of only the following symptoms, and all other reviewed systems are negative.:  Fatigue, sleepiness , snoring, fragmented sleep, HYPERSOMNIA>     How likely are you to doze in the following situations: 0 = not likely, 1 =  slight chance, 2 = moderate chance, 3 = high chance   Sitting and Reading? Watching Television? Sitting inactive in a public place (theater or meeting)? As a passenger in a car for an hour without a break? Lying down in the afternoon when circumstances permit? Sitting and talking to someone? Sitting quietly after lunch without alcohol? In a car, while stopped for a few minutes in traffic?   Total = 19/ 24 points   FSS endorsed at 37/ 63 points.   Social History   Socioeconomic History   Marital status: Divorced    Spouse name: Not on file   Number of children: Not on file   Years of education: Not on file   Highest education level: Not on file  Occupational History   Not on file  Tobacco Use   Smoking status: Never   Smokeless tobacco: Former     Types: Nurse, children's Use: Never used  Substance and Sexual Activity   Alcohol use: Yes    Alcohol/week: 0.0 standard drinks    Comment: 3 to 4 drinks a week   Drug use: No   Sexual activity: Not on file  Other Topics Concern   Not on file  Social History Narrative   Not on file   Social Determinants of Health   Financial Resource Strain: Not on file  Food Insecurity: Not on file  Transportation Needs: Not on file  Physical Activity: Not on file  Stress: Not on file  Social Connections: Not on file    Family History  Problem Relation Age of Onset   Breast cancer Mother    Diabetes Mother    Pancreatic cancer Mother    Throat cancer Father    Alcohol abuse Father    Depression Father    Healthy Brother    Healthy Daughter    Hodgkin's lymphoma Paternal Uncle    Depression Brother    Depression Brother    Cancer Paternal Aunt    Prostate cancer Neg Hx     Past Medical History:  Diagnosis Date   Acid reflux    ADD (attention deficit disorder)    Depression     Past Surgical History:  Procedure Laterality Date   VASECTOMY       Current Outpatient Medications on File Prior to Visit  Medication Sig Dispense Refill   Bioflavonoid Products (BIOFLEX PO) Take 2 tablets by mouth daily.     buPROPion (WELLBUTRIN XL) 300 MG 24 hr tablet TAKE 1 TABLET BY MOUTH EVERY DAY 90 tablet 0   CINNAMON PO Take by mouth daily.     fluticasone (FLONASE) 50 MCG/ACT nasal spray Place 2 sprays into both nostrils daily. 48 mL 12   glucose blood test strip USE TO TEST BLOOD SUGAR LEVELS ONE TO TWO TIMES A WEEK 100 each 12   melatonin 1 MG TABS tablet Take by mouth.     MULTIPLE VITAMIN PO Take by mouth.     rosuvastatin (CRESTOR) 5 MG tablet Take 1 tablet (5 mg total) by mouth daily. 90 tablet 3   No current facility-administered medications on file prior to visit.    No Known Allergies  Physical exam:  Today's Vitals   04/05/21 1537  BP: 128/76  Pulse: 75   Weight: 153 lb (69.4 kg)  Height: 5\' 5"  (1.651 m)   Body mass index is 25.46 kg/m.   Wt Readings from Last 3 Encounters:  04/05/21 153 lb (69.4 kg)  03/14/21  154 lb (69.9 kg)  01/09/21 160 lb (72.6 kg)     Ht Readings from Last 3 Encounters:  04/05/21 5\' 5"  (1.651 m)  03/14/21 5\' 5"  (1.651 m)  05/01/20 5\' 5"  (1.651 m)      General: The patient is awake, alert and appears not in acute distress. The patient is well groomed. Head: Normocephalic, atraumatic. Neck is supple. Mallampati 1,  neck circumference:15 inches . Nasal airflow  patent.  Retrognathia is not  seen.  Dental status:  Cardiovascular:  Regular rate and cardiac rhythm by pulse,  without distended neck veins. Respiratory: Lungs are clear to auscultation.  Skin:  Without evidence of ankle edema, or rash. Trunk: The patient's posture is erect.   Neurologic exam : The patient is awake and alert, oriented to place and time.   Memory subjective described as impaired.  Attention span & concentration ability appears limited-.  Speech is fluent,  without  dysarthria, dysphonia or aphasia.  Mood and affect are appropriate.   Cranial nerves: no loss of smell or taste reported  Pupils are equal and briskly reactive to light. Funduscopic exam deferred.  Extraocular movements in vertical and horizontal planes were intact and without nystagmus. No Diplopia. Visual fields by finger perimetry are intact. Hearing was intact to soft voice and finger rubbing.  Facial sensation intact to fine touch. Facial motor strength is symmetric and tongue and uvula move midline.  Neck ROM : rotation, tilt and flexion extension were normal for age and shoulder shrug was symmetrical.    Motor exam:  Symmetric bulk, tone and ROM.   Normal tone without cog- wheeling, symmetric grip strength .   Sensory:  Fine touch  and vibration were normal.  Proprioception tested in the upper extremities was normal.   Coordination: Rapid alternating  movements in the fingers/hands were of normal speed.  The Finger-to-nose maneuver was intact without evidence of ataxia, dysmetria or tremor.   Gait and station: Patient could rise unassisted from a seated position, walked without assistive device.  Stance is of normal width/ base and the patient turned with steps.  Toe and heel walk were deferred.  Deep tendon reflexes: in the  upper and lower extremities are symmetric and intact.  Babinski response was  normal        After spending a total time of  30  minutes face to face and additional time for physical and neurologic examination, review of laboratory studies,  personal review of imaging studies, reports and results of other testing and review of referral information / records as far as provided in visit, I have established the following assessments:  1) Mr. Smethers describes himself as easily falling asleep when he does any tasks that are not stimulating or associated with the physical activity level.  An Epworth sleepiness score of 19 as definitely exorbitantly high.  He is not necessarily fatigued he has noticed that he is sometimes more forgetful, has trouble remembering details but he is not disoriented, getting lost etc.  He wonders if his degree of sleepiness interferes with some of his memory.  He is of normal BMI his airway has a normal shape and size, there has been no history of pulmonary disease cardiovascular disease cerebrovascular disease etc.  General neurologic examination is nonfocal.  He described fragmented sleep, but dreamless sleep- early morning arousals, depression.    The standard evaluation for excessive daytime sleepiness is based on the presence of vivid dreams #1 sleep paralysis #2 cataplexy #3 -all of  these were negative.  We will need to evaluate for sleep apnea , cardiac arrhythmia, and hypoxemia. He is , to his knowledge , not a snorer.  After we finished our visit , he described a sleep phenomenon of not  feeling truly awake and not feeling truly asleep as it caught in a daydream.  And this is associated with an inability to move.  So I think I have to revise and he may have some touch of narcolepsy even.  I would like to thank [name]., MD and [address] [place],  [place] [address] for allowing me to meet with and to take care of this pleasant patient.   In short, Darryl Larson is presenting with hypersomnia   I plan to follow up  through our NP within 2-4  month.   CC: I will share my notes with PCP.   Electronically signed by: Larey Seat, MD 04/05/2021 3:50 PM  [facility] [place] certified by The [employer] of Sleep Medicine and Diplomate of the [employer] of Sleep Medicine. Board certified In Neurology through the [place], Fellow of the [employer] of Neurology. Medical Director of [employer].

## 2021-04-16 ENCOUNTER — Encounter: Payer: Self-pay | Admitting: Neurology

## 2021-04-16 LAB — NARCOLEPSY EVALUATION
DQA1*01:02: POSITIVE
DQB1*06:02: POSITIVE

## 2021-04-25 ENCOUNTER — Other Ambulatory Visit: Payer: Self-pay

## 2021-04-25 ENCOUNTER — Ambulatory Visit
Admission: RE | Admit: 2021-04-25 | Discharge: 2021-04-25 | Disposition: A | Discharge status: Home / Self Care | Payer: BC Managed Care – PPO | Source: Ambulatory Visit | Attending: Urology | Admitting: Urology

## 2021-04-25 DIAGNOSIS — R3129 Other microscopic hematuria: Secondary | ICD-10-CM | POA: Insufficient documentation

## 2021-05-02 ENCOUNTER — Ambulatory Visit (INDEPENDENT_AMBULATORY_CARE_PROVIDER_SITE_OTHER): Payer: BC Managed Care – PPO | Admitting: Neurology

## 2021-05-02 DIAGNOSIS — R442 Other hallucinations: Secondary | ICD-10-CM

## 2021-05-02 DIAGNOSIS — G4733 Obstructive sleep apnea (adult) (pediatric): Secondary | ICD-10-CM | POA: Diagnosis not present

## 2021-05-02 DIAGNOSIS — G4719 Other hypersomnia: Secondary | ICD-10-CM

## 2021-05-08 NOTE — Progress Notes (Signed)
      [facility] TEST REPORT ( by Watch PAT)   STUDY DATE:  05-03-2021 DOB: 09/04/1961 MRN: [id number]   ORDERING CLINICIAN:  REFERRING CLINICIAN: Miguel Aschoff, MD   CLINICAL INFORMATION/HISTORY: This gentleman was seen on 05 April 2021 as a new patient from Dr. Rosanna Randy.  He endorsed the Epworth Sleepiness Scale at 19 points, fatigue severity at 37 points and describes that he is prone to sleep whenever he is not physically active or mentally stimulated.  He has a history of GERD acid reflux, ADD hyperlipidemia and depression.  He averages only 5 to 6 hours of sleep at night.     Epworth sleepiness score: 19/24.   BMI: 25.3 kg/m   Neck Circumference: 15 inches   FINDINGS:   Sleep Summary:   Total Recording Time (hours, min) was 7 hours 25 minutes of which 6 hours and 28 minutes were sleep time.  The proportion of REM sleep in his sleep time was 27.2%.                                 Respiratory Indices:   Calculated pAHI (per hour): Per watch pat algorithm the overall AHI was 17.6 with a REM AHI of 30.7 and non-REM of 13.3/h, the positional AHI was 16.7 in supine sleep and 20.8 in nonsupine sleep this was only prone sleep.                            Oxygen Saturation Statistics:   Oxygen Saturation (%) Mean: 94% with a minimum of 89 maximum of 99% there were no full minutes of oxygen desaturation recorded.               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  No associated hypoxemia and no associated bradycardia.  Sleep position did not influence the AHI very much. RECOMMENDATION: AnyREM sleep dependent apnea is  usually treated by positive airway pressure.  Given that the patient endorsed a very high degree of daytime  sleepiness and fatigue I think that the treatment should be initiated as soon as possible if his hypersomnia persists after successful CPAP initiation we would have to work him up for narcolepsy.    The auto titration CPAP device will be set between 6 and 16 cmH2O pressure was 2 cm expiratory pressure relief, heated humidification and a mask of the patient's comfort and choice.      INTERPRETING PHYSICIAN:Carmen Dohmeier, MD   Medical Director of [facility].

## 2021-05-15 ENCOUNTER — Ambulatory Visit: Payer: Self-pay | Admitting: Family Medicine

## 2021-05-21 DIAGNOSIS — G4733 Obstructive sleep apnea (adult) (pediatric): Secondary | ICD-10-CM | POA: Insufficient documentation

## 2021-05-21 NOTE — Addendum Note (Signed)
Addended by: Larey Seat on: 05/21/2021 05:22 PM   Modules accepted: Orders

## 2021-05-21 NOTE — Procedures (Signed)
[facility] TEST REPORT ( by Watch PAT)   STUDY DATE:  05-03-2021 DOB: 02/04/1961 MRN: [id number]   ORDERING CLINICIAN:  REFERRING CLINICIAN: Miguel Aschoff, MD   CLINICAL INFORMATION/HISTORY: This gentleman was seen on 05 April 2021 as a new patient from Dr. Rosanna Randy.  He endorsed the Epworth Sleepiness Scale at 19 points, fatigue severity at 37 points and describes that he is prone to sleep whenever he is not physically active or mentally stimulated.  He has a history of GERD acid reflux, ADD hyperlipidemia and depression.  He averages only 5 to 6 hours of sleep at night.     Epworth sleepiness score: 19/24.   BMI: 25.3 kg/m   Neck Circumference: 15 inches   FINDINGS:   Sleep Summary:   Total Recording Time (hours, min) was 7 hours 25 minutes of which 6 hours and 28 minutes were sleep time.  The proportion of REM sleep in his sleep time was 27.2%.                                 Respiratory Indices:   Calculated pAHI (per hour): Per watch pat algorithm the overall AHI was 17.6 with a REM AHI of 30.7 and non-REM of 13.3/h, the positional AHI was 16.7 in supine sleep and 20.8 in nonsupine sleep this was only prone sleep.                            Oxygen Saturation Statistics:   Oxygen Saturation (%) Mean: 94% with a minimum of 89 maximum of 99% there were no full minutes of oxygen desaturation recorded.               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  No associated hypoxemia and no associated bradycardia.  Sleep position did not influence the AHI very much. RECOMMENDATION: AnyREM sleep dependent apnea is  usually treated by positive airway pressure.  Given that the patient endorsed a very high degree of daytime sleepiness and  fatigue I think that the treatment should be initiated as soon as possible if his hypersomnia persists after successful CPAP initiation we would have to work him up for narcolepsy.    The auto titration CPAP device will be set between 6 and 16 cmH2O pressure was 2 cm expiratory pressure relief, heated humidification and a mask of the patient's comfort and choice.      INTERPRETING PHYSICIAN:Shaquon Gropp, MD   Medical Director of [facility].

## 2021-06-05 NOTE — Progress Notes (Signed)
I,April Miller,acting as a scribe for Wilhemena Durie, MD.,have documented all relevant documentation on the behalf of Wilhemena Durie, MD,as directed by  Wilhemena Durie, MD while in the presence of Wilhemena Durie, MD.   Complete physical exam   Patient: Darryl Larson   DOB: July 25, 1961   60 y.o. Male  MRN: [id number] Visit Date: 06/06/2021  Today's healthcare provider: Wilhemena Durie, MD   Chief Complaint  Patient presents with   Annual Exam   Subjective    Darryl Larson is a 60 y.o. male who presents today for a complete physical exam.  He reports consuming a general diet. The patient has a physically strenuous job, but has no regular exercise apart from work.  He generally feels well. He reports sleeping fairly well. He does not have additional problems to discuss today.  HPI  Patient does have left greater than right cataract follow-up ophthalmology   Past Medical History:  Diagnosis Date   Acid reflux    ADD (attention deficit disorder)    Depression    Past Surgical History:  Procedure Laterality Date   VASECTOMY     Social History   Socioeconomic History   Marital status: Divorced    Spouse name: Not on file   Number of children: Not on file   Years of education: Not on file   Highest education level: Not on file  Occupational History   Not on file  Tobacco Use   Smoking status: Never   Smokeless tobacco: Former    Types: Nurse, children's Use: Never used  Substance and Sexual Activity   Alcohol use: Yes    Alcohol/week: 0.0 standard drinks    Comment: 3 to 4 drinks a week   Drug use: No   Sexual activity: Not on file  Other Topics Concern   Not on file  Social History Narrative   Not on file   Social Determinants of Health   Financial Resource Strain: Not on file  Food Insecurity: Not on file  Transportation Needs: Not on file  Physical Activity: Not on file  Stress: Not on file  Social Connections: Not on  file  Intimate Partner Violence: Not on file   Family Status  Relation Name Status   Mother  Deceased   Father  Deceased at age 24   Brother 105 Alive   Daughter  Alive   Psychiatrist  Deceased   Brother 2 Alive   Brother 3 Deceased       suicide   Field seismologist  (Not Specified)   Neg Hx  (Not Specified)   Family History  Problem Relation Age of Onset   Breast cancer Mother    Diabetes Mother    Pancreatic cancer Mother    Throat cancer Father    Alcohol abuse Father    Depression Father    Healthy Brother    Healthy Daughter    Hodgkin's lymphoma Paternal Uncle    Depression Brother    Depression Brother    Cancer Paternal Aunt    Prostate cancer Neg Hx    No Known Allergies  Patient Care Team: [name]., MD as PCP - General (Family Medicine)   Medications: Outpatient Medications Prior to Visit  Medication Sig   buPROPion (WELLBUTRIN XL) 300 MG 24 hr tablet TAKE 1 TABLET BY MOUTH EVERY DAY   CINNAMON PO Take by mouth daily.   fluticasone (FLONASE)  50 MCG/ACT nasal spray Place 2 sprays into both nostrils daily.   glucose blood test strip USE TO TEST BLOOD SUGAR LEVELS ONE TO TWO TIMES A WEEK   melatonin 1 MG TABS tablet Take by mouth.   MULTIPLE VITAMIN PO Take by mouth.   rosuvastatin (CRESTOR) 5 MG tablet Take 1 tablet (5 mg total) by mouth daily.   [DISCONTINUED] Bioflavonoid Products (BIOFLEX PO) Take 2 tablets by mouth daily. (Patient not taking: Reported on 06/06/2021)   No facility-administered medications prior to visit.    Review of Systems  Constitutional:  Positive for fatigue.  Respiratory:  Positive for apnea.   Musculoskeletal:  Positive for back pain and neck pain.  Psychiatric/Behavioral:  Positive for sleep disturbance.   All other systems reviewed and are negative.    Objective    BP 124/74 (BP Location: Left Arm, Patient Position: Sitting, Cuff Size: Large)   Pulse (!) 52   Temp 98.5 F (36.9 C) (Oral)   Resp 16   Ht '5\' 5"'$  (1.651  m)   Wt 159 lb (72.1 kg)   SpO2 95%   BMI 26.46 kg/m    Physical Exam Vitals reviewed.  Constitutional:      Appearance: Normal appearance. He is well-developed.  HENT:     Head: Normocephalic and atraumatic.     Right Ear: Tympanic membrane and external ear normal.     Left Ear: Tympanic membrane and external ear normal.     Nose: Nose normal.  Eyes:     General: No scleral icterus.    Conjunctiva/sclera: Conjunctivae normal.  Neck:     Thyroid: No thyromegaly.  Cardiovascular:     Rate and Rhythm: Normal rate and regular rhythm.     Heart sounds: Normal heart sounds.  Pulmonary:     Effort: Pulmonary effort is normal.     Breath sounds: Normal breath sounds.  Abdominal:     Palpations: Abdomen is soft.  Genitourinary:    Penis: Normal.      Testes: Normal.  Lymphadenopathy:     Cervical: No cervical adenopathy.  Skin:    General: Skin is warm and dry.  Neurological:     General: No focal deficit present.     Mental Status: He is alert and oriented to person, place, and time.  Psychiatric:        Mood and Affect: Mood normal.        Behavior: Behavior normal.        Thought Content: Thought content normal.        Judgment: Judgment normal.      Last depression screening scores PHQ 2/9 Scores 06/06/2021 12/22/2019 04/22/2019  PHQ - 2 Score 3 4 0  PHQ- 9 Score 7 12 -   Last fall risk screening Fall Risk  06/06/2021  Falls in the past year? 0  Number falls in past yr: 0  Injury with Fall? 0  Risk for fall due to : No Fall Risks  Follow up Falls evaluation completed   Last Audit-C alcohol use screening Alcohol Use Disorder Test (AUDIT) 06/06/2021  1. How often do you have a drink containing alcohol? 4  2. How many drinks containing alcohol do you have on a typical day when you are drinking? 0  3. How often do you have six or more drinks on one occasion? 1  AUDIT-C Score 5  4. How often during the last year have you found that you were not able to stop drinking  once you had started? 0  5. How often during the last year have you failed to do what was normally expected from you because of drinking? 0  6. How often during the last year have you needed a first drink in the morning to get yourself going after a heavy drinking session? 0  7. How often during the last year have you had a feeling of guilt of remorse after drinking? 0  8. How often during the last year have you been unable to remember what happened the night before because you had been drinking? 0  9. Have you or someone else been injured as a result of your drinking? 0  10. Has a relative or friend or a doctor or another health worker been concerned about your drinking or suggested you cut down? 0  Alcohol Use Disorder Identification Test Final Score (AUDIT) 5  Alcohol Brief Interventions/Follow-up -   A score of 3 or more in women, and 4 or more in men indicates increased risk for alcohol abuse, EXCEPT if all of the points are from question 1   No results found for any visits on 06/06/21.  Assessment & Plan    Routine Health Maintenance and Physical Exam  Exercise Activities and Dietary recommendations  Goals   None     Immunization History  Administered Date(s) Administered   Moderna Sars-Covid-2 Vaccination 01/05/2020, 02/02/2020   Pneumococcal Polysaccharide-23 12/02/2012   Tdap 11/27/2011    Health Maintenance  Topic Date Due   HIV Screening  Never done   COVID-19 Vaccine (3 - Booster for Moderna series) 07/04/2020   Zoster Vaccines- Shingrix (2 of 2) 01/15/2021   INFLUENZA VACCINE  05/21/2021   TETANUS/TDAP  11/26/2021   COLONOSCOPY (Pts 45-67yr Insurance coverage will need to be confirmed)  06/04/2022   Hepatitis C Screening  Addressed   Pneumococcal Vaccine [id number] old  Aged Out   HPV VACCINES  Aged Out    Discussed health benefits of physical activity, and encouraged him to engage in regular exercise appropriate for his age and condition.  1. Annual  physical exam  - Lipid panel - TSH - CBC w/Diff/Platelet - Comprehensive Metabolic Panel (CMET) - Hemoglobin A1c  2. Other hyperlipidemia  - Lipid panel - TSH - CBC w/Diff/Platelet - Comprehensive Metabolic Panel (CMET) - Hemoglobin A1c  3. Borderline diabetes  - Lipid panel - TSH - CBC w/Diff/Platelet - Comprehensive Metabolic Panel (CMET) - Hemoglobin A1c  4. Prostate cancer screening  - PSA   Return in about 6 months (around 12/07/2021).     I, RWilhemena Durie MD, have reviewed all documentation for this visit. The documentation on 06/15/21 for the exam, diagnosis, procedures, and orders are all accurate and complete.    Stephanye Finnicum GCranford Mon MD  [phone](phone) [phone](fax)  [place]

## 2021-06-06 ENCOUNTER — Encounter: Payer: Self-pay | Admitting: Family Medicine

## 2021-06-06 ENCOUNTER — Ambulatory Visit (INDEPENDENT_AMBULATORY_CARE_PROVIDER_SITE_OTHER): Payer: BC Managed Care – PPO | Admitting: Family Medicine

## 2021-06-06 ENCOUNTER — Other Ambulatory Visit: Payer: Self-pay

## 2021-06-06 VITALS — BP 124/74 | HR 52 | Temp 98.5°F | Resp 16 | Ht 65.0 in | Wt 159.0 lb

## 2021-06-06 DIAGNOSIS — E7849 Other hyperlipidemia: Secondary | ICD-10-CM | POA: Diagnosis not present

## 2021-06-06 DIAGNOSIS — R7303 Prediabetes: Secondary | ICD-10-CM

## 2021-06-06 DIAGNOSIS — Z125 Encounter for screening for malignant neoplasm of prostate: Secondary | ICD-10-CM

## 2021-06-06 DIAGNOSIS — Z0000 Encounter for general adult medical examination without abnormal findings: Secondary | ICD-10-CM

## 2021-06-08 ENCOUNTER — Other Ambulatory Visit: Payer: Self-pay | Admitting: Family Medicine

## 2021-06-08 LAB — CBC WITH DIFFERENTIAL/PLATELET
Basophils Absolute: 0.1 10*3/uL (ref 0.0–0.2)
Basos: 2 %
EOS (ABSOLUTE): 0.1 10*3/uL (ref 0.0–0.4)
Eos: 2 %
Hematocrit: 45.5 % (ref 37.5–51.0)
Hemoglobin: 15.7 g/dL (ref 13.0–17.7)
Immature Grans (Abs): 0 10*3/uL (ref 0.0–0.1)
Immature Granulocytes: 1 %
Lymphocytes Absolute: 1 10*3/uL (ref 0.7–3.1)
Lymphs: 25 %
MCH: 30.5 pg (ref 26.6–33.0)
MCHC: 34.5 g/dL (ref 31.5–35.7)
MCV: 88 fL (ref 79–97)
Monocytes Absolute: 0.5 10*3/uL (ref 0.1–0.9)
Monocytes: 12 %
Neutrophils Absolute: 2.4 10*3/uL (ref 1.4–7.0)
Neutrophils: 58 %
Platelets: 186 10*3/uL (ref 150–450)
RBC: 5.15 x10E6/uL (ref 4.14–5.80)
RDW: 11.9 % (ref 11.6–15.4)
WBC: 4.2 10*3/uL (ref 3.4–10.8)

## 2021-06-08 LAB — COMPREHENSIVE METABOLIC PANEL
ALT: 31 IU/L (ref 0–44)
AST: 26 IU/L (ref 0–40)
Albumin/Globulin Ratio: 2.2 (ref 1.2–2.2)
Albumin: 4.3 g/dL (ref 3.8–4.9)
Alkaline Phosphatase: 59 IU/L (ref 44–121)
BUN/Creatinine Ratio: 24 (ref 10–24)
BUN: 20 mg/dL (ref 8–27)
Bilirubin Total: 0.3 mg/dL (ref 0.0–1.2)
CO2: 26 mmol/L (ref 20–29)
Calcium: 9.3 mg/dL (ref 8.6–10.2)
Chloride: 101 mmol/L (ref 96–106)
Creatinine, Ser: 0.85 mg/dL (ref 0.76–1.27)
Globulin, Total: 2 g/dL (ref 1.5–4.5)
Glucose: 116 mg/dL — ABNORMAL HIGH (ref 65–99)
Potassium: 4.6 mmol/L (ref 3.5–5.2)
Sodium: 141 mmol/L (ref 134–144)
Total Protein: 6.3 g/dL (ref 6.0–8.5)
eGFR: 99 mL/min/{1.73_m2} (ref >59)

## 2021-06-08 LAB — LIPID PANEL
Chol/HDL Ratio: 3.3 ratio (ref 0.0–5.0)
Cholesterol, Total: 148 mg/dL (ref 100–199)
HDL: 45 mg/dL (ref >39)
LDL Chol Calc (NIH): 88 mg/dL (ref 0–99)
Triglycerides: 77 mg/dL (ref 0–149)
VLDL Cholesterol Cal: 15 mg/dL (ref 5–40)

## 2021-06-08 LAB — HEMOGLOBIN A1C
Est. average glucose Bld gHb Est-mCnc: 126 mg/dL
Hgb A1c MFr Bld: 6 % — ABNORMAL HIGH (ref 4.8–5.6)

## 2021-06-08 LAB — PSA: Prostate Specific Ag, Serum: 0.7 ng/mL (ref 0.0–4.0)

## 2021-06-08 LAB — TSH: TSH: 1.07 u[IU]/mL (ref 0.450–4.500)

## 2021-06-11 ENCOUNTER — Encounter: Payer: Self-pay | Admitting: Family Medicine

## 2021-08-09 ENCOUNTER — Ambulatory Visit: Payer: Self-pay

## 2021-08-09 NOTE — Telephone Encounter (Signed)
Pt called in stating he is having right lower back pain. He had a back injury over a year ago from lifting a couch up some apt stairs and ended up falling back on the concrete steps, the couch landed on his chest. He seen Dr. Rosanna Randy for the injury when it happened and has been seeing a Chiropractor since to just get a "tune up". He says that normally after the Chiropractor visit it feels better again but has had the pain since the accident. Pt takes Advil, or tylenol and uses Bengay to help with the pain. He is wanting to see what he needs to do to get a referral and who he needs to see besides PCP. Advised since it has been over a year ago, he needs to see PCP in order to get a referral. D/t office being at lunch will route this note to them and someone will give him a call back with an appt or recommendations. Care advice given and pt verbalized understanding.   Reason for Disposition  Back pain present > 2 weeks  Answer Assessment - Initial Assessment Questions 1. ONSET: "When did the pain begin?"      Over yr ago  2. LOCATION: "Where does it hurt?" (upper, mid or lower back)     Lower, right 3. SEVERITY: "How bad is the pain?"  (e.g., Scale 1-10; mild, moderate, or severe)   - MILD (1-3): doesn't interfere with normal activities    - MODERATE (4-7): interferes with normal activities or awakens from sleep    - SEVERE (8-10): excruciating pain, unable to do any normal activities      6 4. PATTERN: "Is the pain constant?" (e.g., yes, no; constant, intermittent)      Come and go 5. RADIATION: "Does the pain shoot into your legs or elsewhere?"     Radiates down right down 6. CAUSE:  "What do you think is causing the back pain?"      Injury year ago from lifting couch up apt steps 7. BACK OVERUSE:  "Any recent lifting of heavy objects, strenuous work or exercise?"     [place]  8. MEDICATIONS: "What have you taken so far for the pain?" (e.g., nothing, acetaminophen, NSAIDS)      Advil, tylenol, bengay creme 9. NEUROLOGIC SYMPTOMS: "Do you have any weakness, numbness, or problems with bowel/bladder control?"     Weakness when getting up and moving 10. OTHER SYMPTOMS: "Do you have any other symptoms?" (e.g., fever, abdominal pain, burning with urination, blood in urine)       No 11. PREGNANCY: "Is there any chance you are pregnant?" (e.g., yes, no; LMP)       N/A  Protocols used: Back Pain-A-AH

## 2021-08-10 ENCOUNTER — Other Ambulatory Visit: Payer: Self-pay | Admitting: *Deleted

## 2021-08-10 DIAGNOSIS — G8929 Other chronic pain: Secondary | ICD-10-CM

## 2021-08-10 DIAGNOSIS — M545 Low back pain, unspecified: Secondary | ICD-10-CM

## 2021-08-10 NOTE — Telephone Encounter (Signed)
Referral ordered

## 2021-08-23 ENCOUNTER — Ambulatory Visit: Payer: BC Managed Care – PPO | Admitting: Neurology

## 2021-08-23 ENCOUNTER — Telehealth: Payer: Self-pay

## 2021-08-23 NOTE — Telephone Encounter (Signed)
Pt had appt today for initial CPAP f/u. I was notified by Jeneen Rinks that they have tried reaching pt multiple times. Called and spoke to pt, that her has not been called. I provided pt [facility] number to f/u and to call us once he picks up CPAP to re-sch his f/u appt. Pt verbalized understanding and had no further concerns.

## 2021-08-27 IMAGING — US US RENAL
2 series · 14 of 25 positions shown · non-contrast
Comparison: CT abdomen and pelvis 07/31/2016.

CLINICAL DATA: Microscopic hematuria.

EXAM:
RENAL / URINARY TRACT ULTRASOUND COMPLETE

[Series 1: us renal · 13 of 51 slices shown]
[im 1/51]
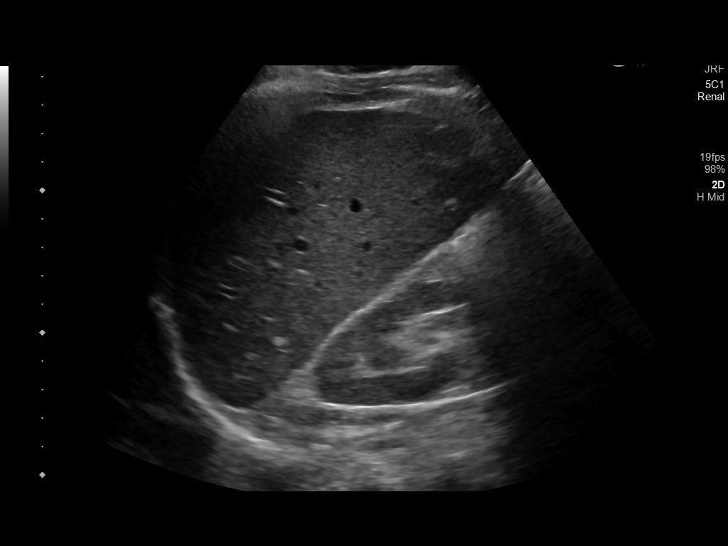
[im 5/51]
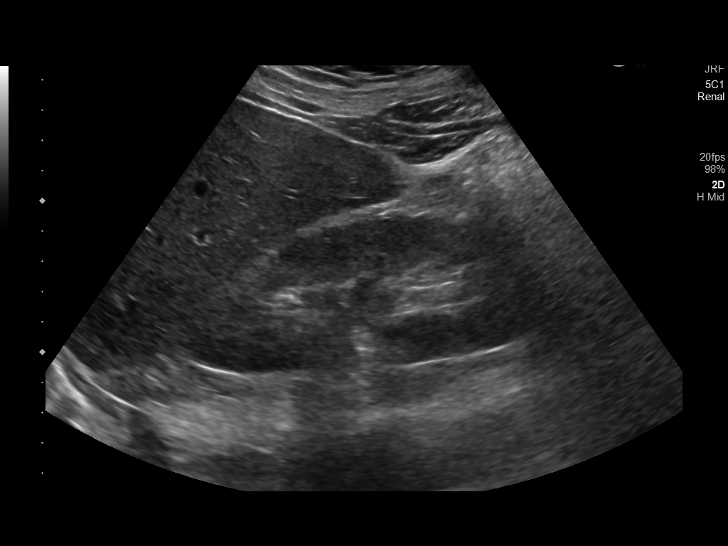
[im 10/51]
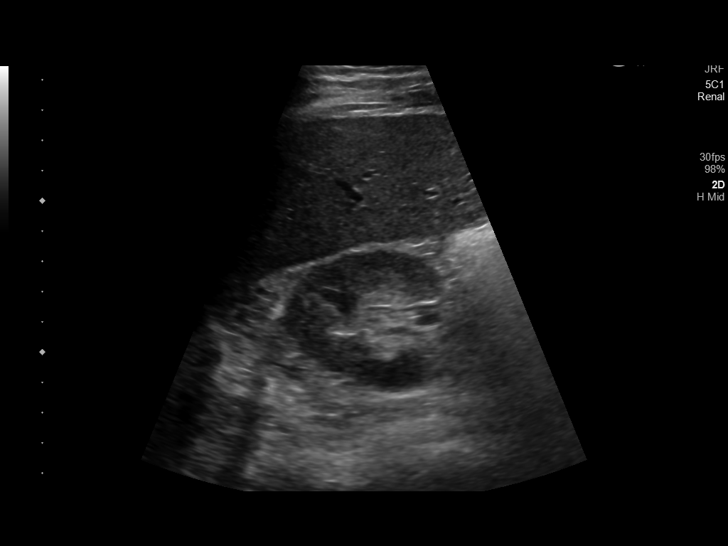
[im 14/51]
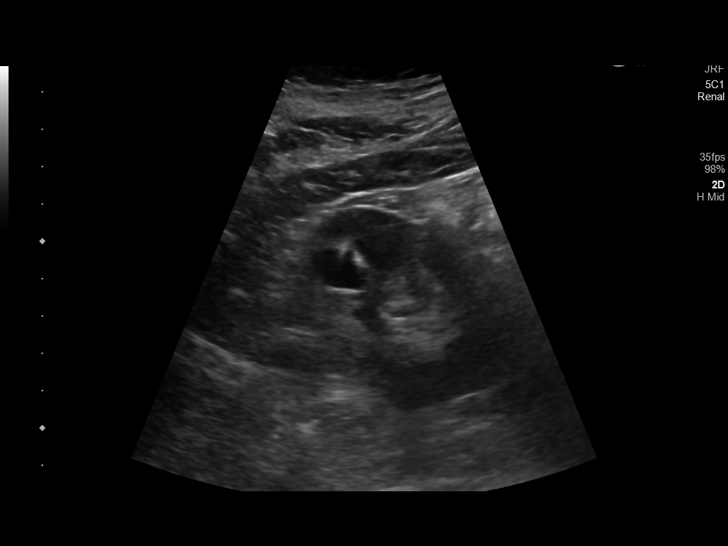
[im 19/51]
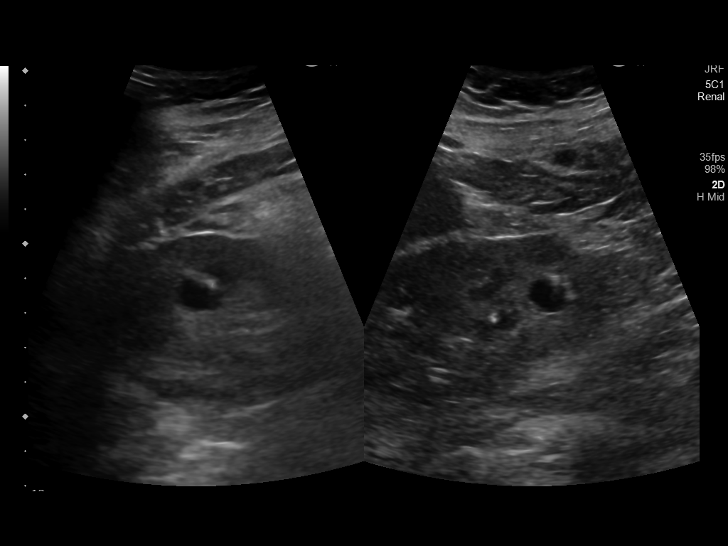
[im 21/51]
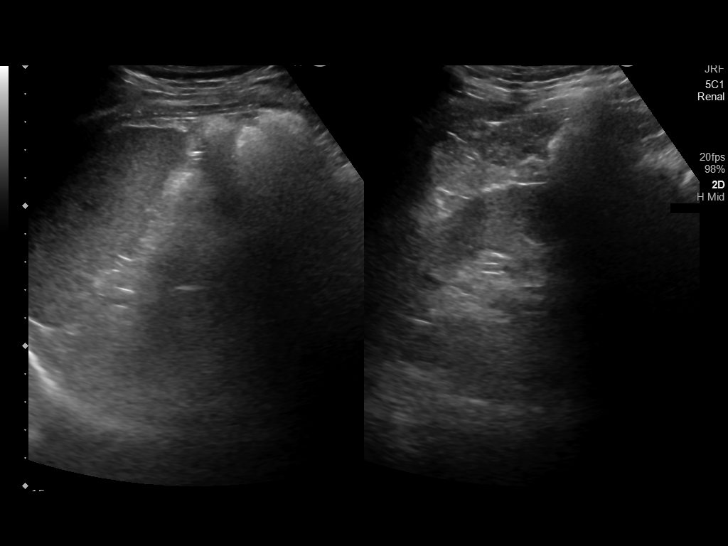
[im 26/51]
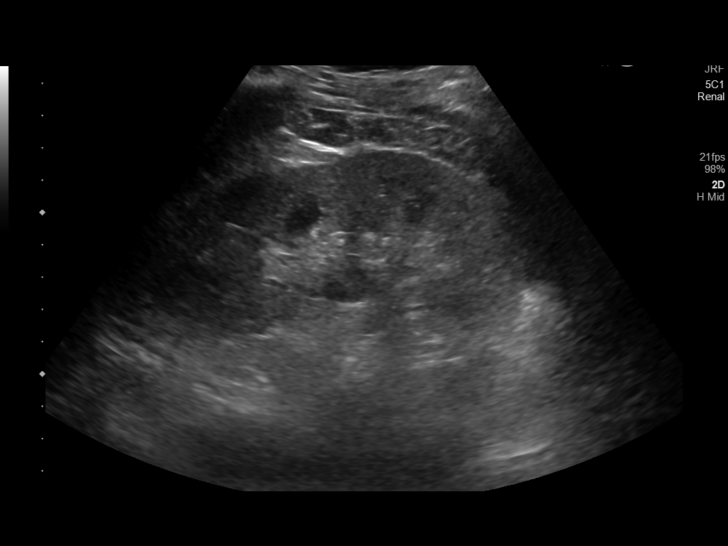
[im 30/51]
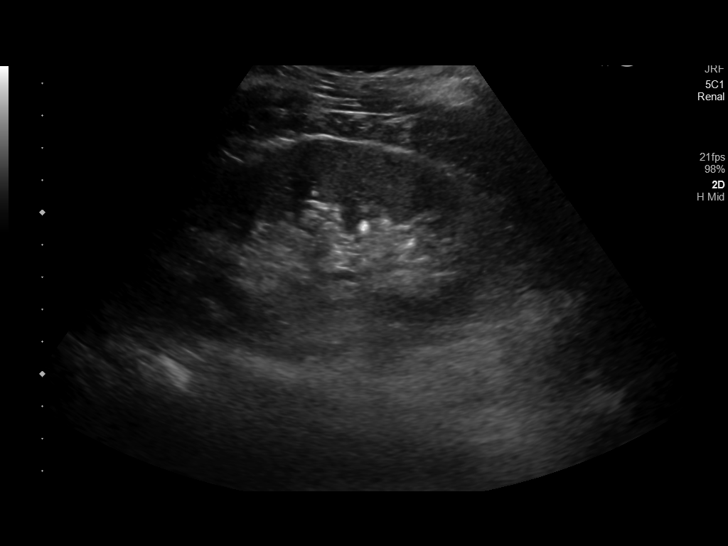
[im 35/51]
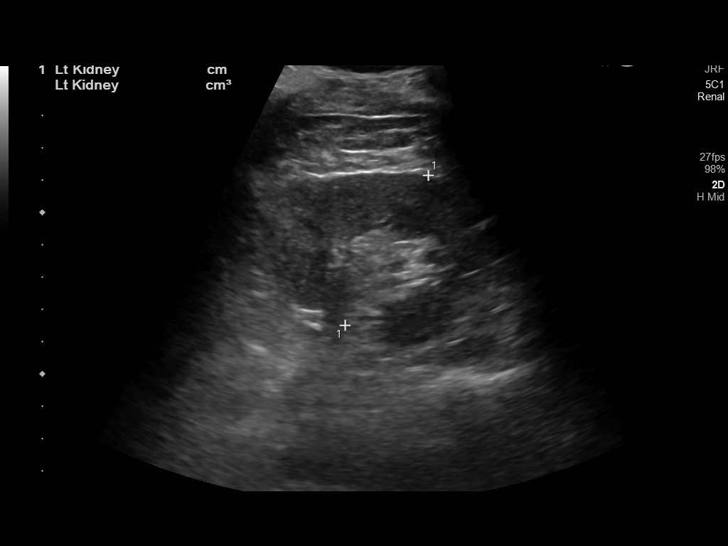
[im 37/51]
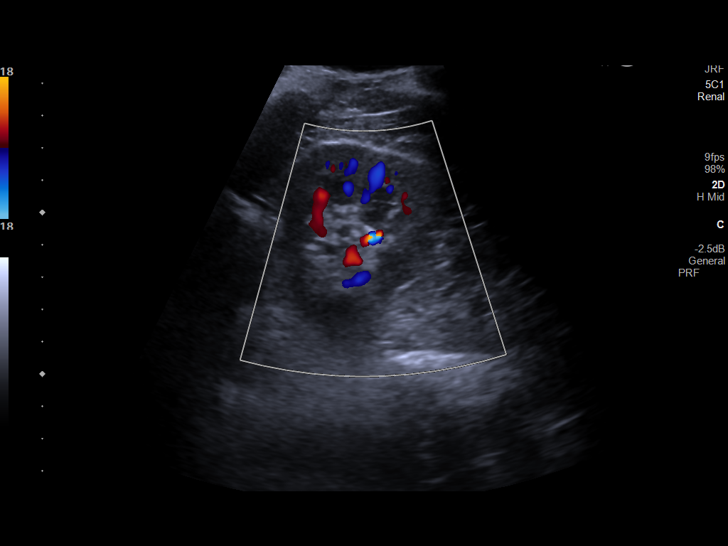
[im 41/51]
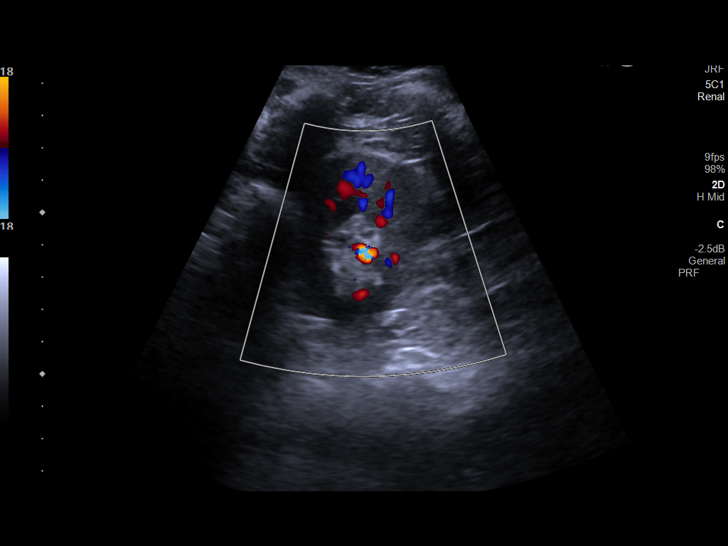
[im 46/51]
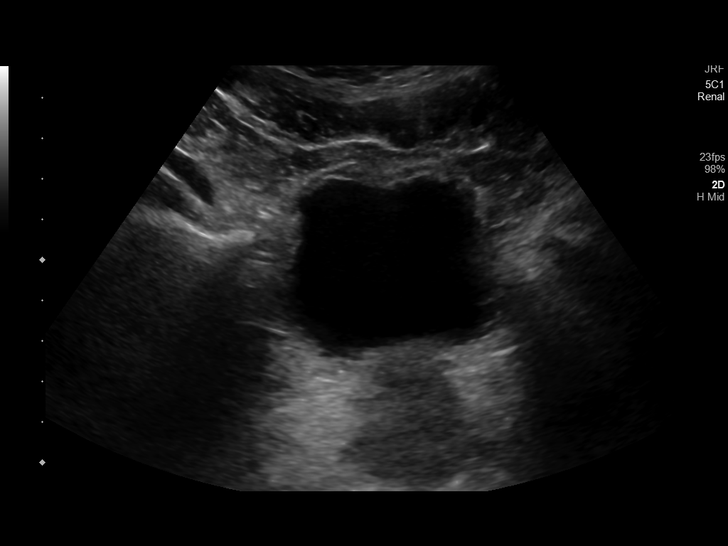
[im 51/51]
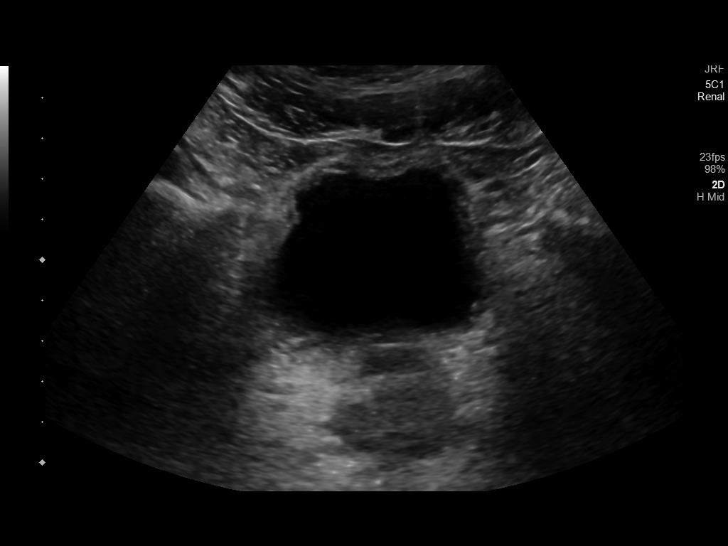

[Series 1001: renal · 1 of 5 slices shown]
[im 5/5]
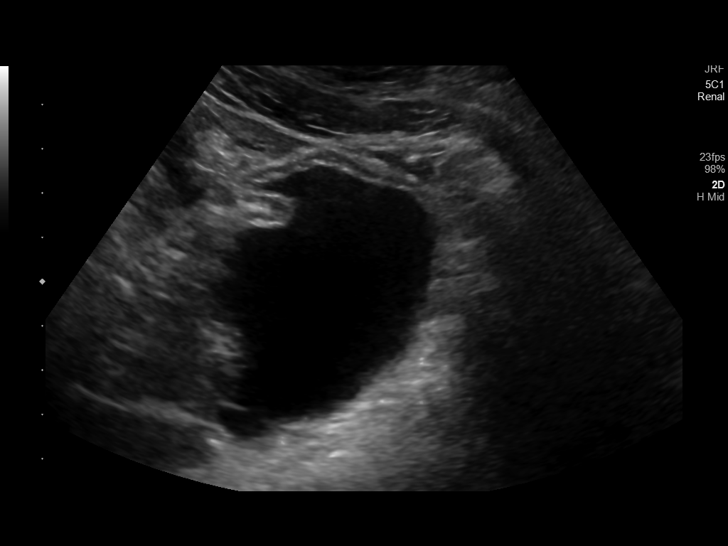

[14 of 25 positions shown; findings below may reference images not displayed]

FINDINGS: Right Kidney:

Renal measurements: 12.4 x 5.0 x 5.4 cm = volume: 174 mL.
Echogenicity within normal limits. 1.3 cm simple cyst lower pole is
unchanged. No solid mass or hydronephrosis visualized.

Left Kidney:

Renal measurements: 12.1 x 5.6 x 5.3 cm = volume: 188 mL.
Echogenicity within normal limits. A 0.5 cm echogenic focus in the
lower pole may be a nonobstructing stone. No mass or hydronephrosis
visualized.

Bladder:

Appears normal for degree of bladder distention.

Other:

None.
IMPRESSION: Negative for hydronephrosis or acute abnormality.

Small, simple cyst lower pole right kidney.

Possible 0.5 cm nonobstructing stone left kidney.

## 2021-10-18 LAB — HM DIABETES EYE EXAM

## 2021-10-26 ENCOUNTER — Other Ambulatory Visit: Payer: Self-pay | Admitting: Family Medicine

## 2021-10-26 DIAGNOSIS — E7849 Other hyperlipidemia: Secondary | ICD-10-CM

## 2021-10-26 NOTE — Telephone Encounter (Signed)
Requested Prescriptions  Pending Prescriptions Disp Refills   rosuvastatin (CRESTOR) 5 MG tablet [Pharmacy Med Name: ROSUVASTATIN CALCIUM 5 MG TAB] 90 tablet 2    Sig: TAKE 1 TABLET (5 MG TOTAL) BY MOUTH DAILY.     Cardiovascular:  Antilipid - Statins Passed - 10/26/2021  1:34 AM      Passed - Total Cholesterol in normal range and within 360 days    Cholesterol, Total  Date Value Ref Range Status  06/07/2021 148 100 - 199 mg/dL Final         Passed - LDL in normal range and within 360 days    LDL Chol Calc (NIH)  Date Value Ref Range Status  06/07/2021 88 0 - 99 mg/dL Final         Passed - HDL in normal range and within 360 days    HDL  Date Value Ref Range Status  06/07/2021 45 >39 mg/dL Final         Passed - Triglycerides in normal range and within 360 days    Triglycerides  Date Value Ref Range Status  06/07/2021 77 0 - 149 mg/dL Final         Passed - Patient is not pregnant      Passed - Valid encounter within last 12 months    Recent Outpatient Visits          4 months ago Annual physical exam   [facility] [name]., MD   9 months ago Other hyperlipidemia   [facility] [name]., MD   11 months ago Borderline diabetes   [facility] [name]., MD   1 year ago Annual physical exam   [facility] [name]., MD   1 year ago Microscopic hematuria   [facility] [name]., MD      Future Appointments            In 1 month [name]., MD [facility], [place]

## 2021-11-15 ENCOUNTER — Ambulatory Visit: Payer: Self-pay | Admitting: Urology

## 2021-11-21 ENCOUNTER — Other Ambulatory Visit: Payer: Self-pay

## 2021-11-21 ENCOUNTER — Encounter: Payer: Self-pay | Admitting: Urology

## 2021-11-21 ENCOUNTER — Ambulatory Visit: Payer: BC Managed Care – PPO | Admitting: Urology

## 2021-11-21 VITALS — BP 162/77 | HR 53 | Ht 65.0 in | Wt 160.4 lb

## 2021-11-21 DIAGNOSIS — R350 Frequency of micturition: Secondary | ICD-10-CM | POA: Diagnosis not present

## 2021-11-21 DIAGNOSIS — R399 Unspecified symptoms and signs involving the genitourinary system: Secondary | ICD-10-CM | POA: Diagnosis not present

## 2021-11-21 DIAGNOSIS — R3121 Asymptomatic microscopic hematuria: Secondary | ICD-10-CM

## 2021-11-21 DIAGNOSIS — N200 Calculus of kidney: Secondary | ICD-10-CM

## 2021-11-21 LAB — URINALYSIS, COMPLETE
Bilirubin, UA: NEGATIVE
Glucose, UA: NEGATIVE
Ketones, UA: NEGATIVE
Leukocytes,UA: NEGATIVE
Nitrite, UA: NEGATIVE
Protein,UA: NEGATIVE
Specific Gravity, UA: 1.01 (ref 1.005–1.030)
Urobilinogen, Ur: 0.2 mg/dL (ref 0.2–1.0)
pH, UA: 5.5 (ref 5.0–7.5)

## 2021-11-21 LAB — MICROSCOPIC EXAMINATION
Bacteria, UA: NONE SEEN
Epithelial Cells (non renal): NONE SEEN /hpf (ref 0–10)

## 2021-11-21 NOTE — Progress Notes (Signed)
° °  11/21/2021 9:18 AM   Barry Dienes 03-04-1961 [id number]  Reason for visit: Follow up microscopic hematuria, urinary symptoms, possible stone on ultrasound  HPI: Very healthy 61 year old male with no smoking history or other carcinogenic exposures who has a long history of microscopic hematuria.He had 11-30 RBCs in 2017 and underwent a CT urogram and cystoscopy with Dr. Pilar Jarvis that was normal.  He has never had gross hematuria, UTIs, or flank pain.  He had a significant mount of pain with a cystoscopy at that time.  In May 2021 he had 20-50 RBCs, and using shared decision making he opted for a voided cytology which was benign.  Urinalysis last year was suspicious for infection, but unfortunately was not sent for culture, and he was treated with 5 days of Bactrim.  I recommended an ultrasound for further evaluation.  I personally viewed and interpreted the renal ultrasound dated 04/25/2021 showing no hydronephrosis or abnormal lesions, and possible nonobstructing 5 mm lower pole stone.  Reassurance provided.  He denies any problems over the last year.  He has some mild frequency and urgency during the day that is minimally bothersome.  He drinks some diet sodas and we discussed behavioral strategies.  Urinalysis today is completely benign with 0-2 RBCs.  Reassurance provided, recommended continuing yearly follow-up for UA to evaluate microscopic hematuria and to monitor mild urinary symptoms  Billey Co, MD  [facility] [address], [place], [place] [address] [phone]

## 2021-12-04 NOTE — Progress Notes (Signed)
Established patient visit   Patient: Darryl Larson   DOB: February 03, 1961   61 y.o. Male  MRN: [id number] Visit Date: 12/06/2021  Today's healthcare provider: Wilhemena Durie, MD   Chief Complaint  Patient presents with   Follow-up   Hyperlipidemia   Prediabetes   Subjective    HPI  Patient comes in today for follow-up.  Overall he is doing well.  His back pain is better.   Lipid/Cholesterol, Follow-up  Last lipid panel Other pertinent labs  Lab Results  Component Value Date   CHOL 148 06/07/2021   HDL 45 06/07/2021   LDLCALC 88 06/07/2021   TRIG 77 06/07/2021   CHOLHDL 3.3 06/07/2021   Lab Results  Component Value Date   ALT 31 06/07/2021   AST 26 06/07/2021   PLT 186 06/07/2021   TSH 1.070 06/07/2021     He was last seen for this 6 months ago.  Management since that visit includes; labs were done.  He reports good compliance with treatment. He is not having side effects. none  Current diet: in general, an "unhealthy" diet Current exercise: walking  The 10-year ASCVD risk score (Arnett DK, et al., 2019) is: 7.9%  ---------------------------------------------------------------------------------------------------  Follow-up, Borderline Diabetes  Lab Results  Component Value Date   HGBA1C 6.0 (H) 06/07/2021   HGBA1C 6.2 (H) 11/02/2020   HGBA1C 5.9 (H) 05/02/2020   Wt Readings from Last 3 Encounters:  12/06/21 161 lb 9.6 oz (73.3 kg)  11/21/21 160 lb 6.4 oz (72.8 kg)  06/06/21 159 lb (72.1 kg)   Last seen for borderline diabetes 6 months ago.  Management since then includes; labs were done. He reports fair compliance with treatment. He is not having side effects. none  Home blood sugar records: not checked Current exercise: walking Current diet habits: in general, an "unhealthy" diet  Pertinent Labs: Lab Results  Component Value Date   CHOL 148 06/07/2021   HDL 45 06/07/2021   LDLCALC 88 06/07/2021   TRIG 77 06/07/2021   CHOLHDL 3.3  06/07/2021   Lab Results  Component Value Date   NA 141 06/07/2021   K 4.6 06/07/2021   CREATININE 0.85 06/07/2021   EGFR 99 06/07/2021   LABMICR See below: 11/21/2021     --------------------------------------------------------------------------------------------------- Medications: Outpatient Medications Prior to Visit  Medication Sig   Ascorbic Acid (VITAMIN C) 500 MG CHEW Take 500 mg by mouth daily.   CINNAMON PO Take by mouth daily.   fluticasone (FLONASE) 50 MCG/ACT nasal spray Place 2 sprays into both nostrils daily.   glucose blood test strip USE TO TEST BLOOD SUGAR LEVELS ONE TO TWO TIMES A WEEK   MULTIPLE VITAMIN PO Take by mouth.   psyllium (METAMUCIL) 58.6 % powder Take 1 packet by mouth 3 (three) times daily.   rosuvastatin (CRESTOR) 5 MG tablet TAKE 1 TABLET (5 MG TOTAL) BY MOUTH DAILY.   No facility-administered medications prior to visit.    Review of Systems  All other systems reviewed and are negative.  Last hemoglobin A1c Lab Results  Component Value Date   HGBA1C 6.2 (H) 12/06/2021       Objective    BP 129/75 (BP Location: Right Arm, Patient Position: Sitting, Cuff Size: Normal)    Pulse (!) 49    Temp 98.6 F (37 C) (Temporal)    Resp 16    Ht 5' 5"  (1.651 m)    Wt 161 lb 9.6 oz (73.3 kg)  SpO2 99%    BMI 26.89 kg/m  BP Readings from Last 3 Encounters:  12/06/21 129/75  11/21/21 (!) 162/77  06/06/21 124/74   Wt Readings from Last 3 Encounters:  12/06/21 161 lb 9.6 oz (73.3 kg)  11/21/21 160 lb 6.4 oz (72.8 kg)  06/06/21 159 lb (72.1 kg)      Physical Exam Vitals reviewed.  Constitutional:      Appearance: Normal appearance.  HENT:     Head: Normocephalic and atraumatic.  Eyes:     General: No scleral icterus. Cardiovascular:     Rate and Rhythm: Normal rate and regular rhythm.     Pulses: Normal pulses.     Heart sounds: Normal heart sounds.  Pulmonary:     Breath sounds: Normal breath sounds.  Abdominal:     Palpations:  Abdomen is soft.  Skin:    General: Skin is warm and dry.  Neurological:     General: No focal deficit present.     Mental Status: He is alert and oriented to person, place, and time.     Motor: No weakness.  Psychiatric:        Mood and Affect: Mood normal.        Behavior: Behavior normal.        Thought Content: Thought content normal.        Judgment: Judgment normal.      No results found for any visits on 12/06/21.  Assessment & Plan     1. Borderline diabetes Continue to watch diet and exercise. - Hemoglobin A1c  2. Other hyperlipidemia On rosuvastatin 5  3. Other depression On Wellbutrin daily.  4. Adult attention deficit disorder   5. Chronic midline low back pain with bilateral sciatica Improved   Return in about 6 months (around 06/05/2022).      I, Wilhemena Durie, MD, have reviewed all documentation for this visit. The documentation on 12/11/21 for the exam, diagnosis, procedures, and orders are all accurate and complete.    Margaree Sandhu Cranford Mon, MD  [facility] [phone] (phone) [phone] (fax)  [place]

## 2021-12-06 ENCOUNTER — Encounter: Payer: Self-pay | Admitting: Family Medicine

## 2021-12-06 ENCOUNTER — Ambulatory Visit: Payer: BC Managed Care – PPO | Admitting: Family Medicine

## 2021-12-06 ENCOUNTER — Other Ambulatory Visit: Payer: Self-pay

## 2021-12-06 VITALS — BP 129/75 | HR 49 | Temp 98.6°F | Resp 16 | Ht 65.0 in | Wt 161.6 lb

## 2021-12-06 DIAGNOSIS — R7303 Prediabetes: Secondary | ICD-10-CM

## 2021-12-06 DIAGNOSIS — F3289 Other specified depressive episodes: Secondary | ICD-10-CM

## 2021-12-06 DIAGNOSIS — F988 Other specified behavioral and emotional disorders with onset usually occurring in childhood and adolescence: Secondary | ICD-10-CM

## 2021-12-06 DIAGNOSIS — M5442 Lumbago with sciatica, left side: Secondary | ICD-10-CM

## 2021-12-06 DIAGNOSIS — G8929 Other chronic pain: Secondary | ICD-10-CM

## 2021-12-06 DIAGNOSIS — E7849 Other hyperlipidemia: Secondary | ICD-10-CM

## 2021-12-06 DIAGNOSIS — M5441 Lumbago with sciatica, right side: Secondary | ICD-10-CM

## 2021-12-07 LAB — HEMOGLOBIN A1C
Est. average glucose Bld gHb Est-mCnc: 131 mg/dL
Hgb A1c MFr Bld: 6.2 % — ABNORMAL HIGH (ref 4.8–5.6)

## 2021-12-13 ENCOUNTER — Ambulatory Visit: Payer: Self-pay

## 2021-12-13 ENCOUNTER — Telehealth: Payer: BC Managed Care – PPO | Admitting: Physician Assistant

## 2021-12-13 DIAGNOSIS — U071 COVID-19: Secondary | ICD-10-CM

## 2021-12-13 MED ORDER — MOLNUPIRAVIR EUA 200MG CAPSULE: 4.0000 | ORAL_CAPSULE | Freq: Two times a day (BID) | ORAL | 0 refills | Status: AC

## 2021-12-13 NOTE — Telephone Encounter (Signed)
Patient called in and stated that he tested positive for Covid and need to have the antiviral medication prescribed please can be reached at Ph# [phone]    Chief Complaint: COVID positive today Symptoms: Congestion, mild cough, fatigue, Temp. 100 Frequency: Started today Pertinent Negatives: Patient denies  Disposition: [] ED /[] Urgent Care (no appt availability in office) / [] Appointment(In office/virtual)/ [x]  [facility]/ [] Home Care/ [] Refused Recommended Disposition /[] [facility]/ []  Follow-up with PCP Additional Notes: Asking for anti-viral. Will do [facility] Virtual visit.   Answer Assessment - Initial Assessment Questions 1. COVID-19 DIAGNOSIS: "Who made your COVID-19 diagnosis?" "Was it confirmed by a positive lab test or self-test?" If not diagnosed by a doctor (or NP/PA), ask "Are there lots of cases (community spread) where you live?" Note: See public health department website, if unsure.     Home test 2. COVID-19 EXPOSURE: "Was there any known exposure to COVID before the symptoms began?" CDC Definition of close contact: within 6 feet (2 meters) for a total of 15 minutes or more over a 24-hour period.      Yes 3. ONSET: "When did the COVID-19 symptoms start?"      Today 4. WORST SYMPTOM: "What is your worst symptom?" (e.g., cough, fever, shortness of breath, muscle aches)     Congestion 5. COUGH: "Do you have a cough?" If Yes, ask: "How bad is the cough?"       Yes - mild 6. FEVER: "Do you have a fever?" If Yes, ask: "What is your temperature, how was it measured, and when did it start?"     100 7. RESPIRATORY STATUS: "Describe your breathing?" (e.g., shortness of breath, wheezing, unable to speak)      Mild SOB 8. BETTER-SAME-WORSE: "Are you getting better, staying the same or getting worse compared to yesterday?"  If getting worse, ask, "In what way?"     Same 9. HIGH RISK DISEASE: "Do you have any chronic medical problems?" (e.g., asthma, heart or  lung disease, weak immune system, obesity, etc.)     Pre-diabetes 10. VACCINE: "Have you had the COVID-19 vaccine?" If Yes, ask: "Which one, how many shots, when did you get it?"       N/a 11. BOOSTER: "Have you received your COVID-19 booster?" If Yes, ask: "Which one and when did you get it?"       N/a 12. PREGNANCY: "Is there any chance you are pregnant?" "When was your last menstrual period?"       N/a 13. OTHER SYMPTOMS: "Do you have any other symptoms?"  (e.g., chills, fatigue, headache, loss of smell or taste, muscle pain, sore throat)       Fatigue 14. O2 SATURATION MONITOR:  "Do you use an oxygen saturation monitor (pulse oximeter) at home?" If Yes, ask "What is your reading (oxygen level) today?" "What is your usual oxygen saturation reading?" (e.g., 95%)       No  Protocols used: Coronavirus (COVID-19) Diagnosed or Suspected-A-AH

## 2021-12-13 NOTE — Progress Notes (Signed)
Virtual Visit Consent   Darryl Larson, you are scheduled for a virtual visit with a [facility] provider today.     Just as with appointments in the office, your consent must be obtained to participate.  Your consent will be active for this visit and any virtual visit you may have with one of our providers in the next 365 days.     If you have a MyChart account, a copy of this consent can be sent to you electronically.  All virtual visits are billed to your insurance company just like a traditional visit in the office.    As this is a virtual visit, video technology does not allow for your provider to perform a traditional examination.  This may limit your provider's ability to fully assess your condition.  If your provider identifies any concerns that need to be evaluated in person or the need to arrange testing (such as labs, EKG, etc.), we will make arrangements to do so.     Although advances in technology are sophisticated, we cannot ensure that it will always work on either your end or our end.  If the connection with a video visit is poor, the visit may have to be switched to a telephone visit.  With either a video or telephone visit, we are not always able to ensure that we have a secure connection.     I need to obtain your verbal consent now.   Are you willing to proceed with your visit today?    [name] has provided verbal consent on 12/13/2021 for a virtual visit (video or telephone).   [name], PA-C   Date: 12/13/2021 2:42 PM   Virtual Visit via Video Note   [name], connected with  Darryl Larson  ([id number], 04/27/1961) on 12/13/21 at  2:30 PM EST by a video-enabled telemedicine application and verified that I am speaking with the correct person using two identifiers.  Location: Patient: Virtual Visit Location Patient: Home Provider: Virtual Visit Location Provider: Home Office   I discussed the limitations of evaluation and management  by telemedicine and the availability of in person appointments. The patient expressed understanding and agreed to proceed.    History of Present Illness: Darryl Larson is a 61 y.o. who identifies as a male who was assigned male at birth, and is being seen today for Covid 20.  HPI: URI  This is a new problem. Episode onset: Tested positive for covid 19 this morning, symptoms started yesterday. The problem has been gradually worsening. The maximum temperature recorded prior to his arrival was 100.4 - 100.9 F. The fever has been present for Less than 1 day. Associated symptoms include congestion, coughing (dry, mild), rhinorrhea, sneezing and a sore throat. Pertinent negatives include no diarrhea, ear pain, headaches, nausea, plugged ear sensation, sinus pain, vomiting or wheezing. Associated symptoms comments: Chills, fatigue, post nasal drainage. He has tried increased fluids and sleep for the symptoms. The treatment provided no relief.     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/2015    Allergies: No Known Allergies Medications:  Current Outpatient Medications:    molnupiravir EUA (LAGEVRIO) 200 mg CAPS capsule, Take 4 capsules (800 mg total) by mouth 2 (two) times daily for 5 days., Disp: 40 capsule, Rfl: 0   Ascorbic Acid (VITAMIN C) 500 MG CHEW, Take 500 mg by mouth daily., Disp: , Rfl:    CINNAMON PO, Take by mouth daily., Disp: , Rfl:    fluticasone  (FLONASE) 50 MCG/ACT nasal spray, Place 2 sprays into both nostrils daily., Disp: 48 mL, Rfl: 12   glucose blood test strip, USE TO TEST BLOOD SUGAR LEVELS ONE TO TWO TIMES A WEEK, Disp: 100 each, Rfl: 12   MULTIPLE VITAMIN PO, Take by mouth., Disp: , Rfl:    psyllium (METAMUCIL) 58.6 % powder, Take 1 packet by mouth 3 (three) times daily., Disp: , Rfl:    rosuvastatin (CRESTOR) 5 MG tablet, TAKE 1 TABLET (5 MG TOTAL) BY MOUTH DAILY., Disp: 90 tablet, Rfl: 2  Observations/Objective: Patient is well-developed, well-nourished in no acute distress.  Resting comfortably at home.  Head is normocephalic, atraumatic.  No labored breathing.  Speech is clear and coherent with logical content.  Patient is alert and oriented at baseline.    Assessment and Plan: 1. COVID-19 - molnupiravir EUA (LAGEVRIO) 200 mg CAPS capsule; Take 4 capsules (800 mg total) by mouth 2 (two) times daily for 5 days.  Dispense: 40 capsule; Refill: 0  - Continue OTC symptomatic management of choice - Will send OTC vitamins and supplement information through AVS - Molnupiravir prescribed - Patient enrolled in MyChart symptom monitoring - Push fluids - Rest as needed - Discussed return precautions and when to seek in-person evaluation, sent via AVS as well  Follow Up Instructions: I discussed the assessment and treatment plan with the patient. The patient was provided an opportunity to ask questions and all were answered. The patient agreed with the plan and demonstrated an understanding of the instructions.  A copy of instructions were sent to the patient via MyChart unless otherwise noted below.   The patient was advised to call back or seek an in-person evaluation if the symptoms worsen or if the condition fails to improve as anticipated.  Time:  I spent 13 minutes with the patient via telehealth technology discussing the above problems/concerns.    [name], PA-C

## 2021-12-13 NOTE — Patient Instructions (Signed)
Barry Dienes, thank you for joining [name], PA-C for today's virtual visit.  While this provider is not your primary care provider (PCP), if your PCP is located in our provider database this encounter information will be shared with them immediately following your visit.  Consent: (Patient) Darryl Larson provided verbal consent for this virtual visit at the beginning of the encounter.  Current Medications:  Current Outpatient Medications:    molnupiravir EUA (LAGEVRIO) 200 mg CAPS capsule, Take 4 capsules (800 mg total) by mouth 2 (two) times daily for 5 days., Disp: 40 capsule, Rfl: 0   Ascorbic Acid (VITAMIN C) 500 MG CHEW, Take 500 mg by mouth daily., Disp: , Rfl:    CINNAMON PO, Take by mouth daily., Disp: , Rfl:    fluticasone (FLONASE) 50 MCG/ACT nasal spray, Place 2 sprays into both nostrils daily., Disp: 48 mL, Rfl: 12   glucose blood test strip, USE TO TEST BLOOD SUGAR LEVELS ONE TO TWO TIMES A WEEK, Disp: 100 each, Rfl: 12   MULTIPLE VITAMIN PO, Take by mouth., Disp: , Rfl:    psyllium (METAMUCIL) 58.6 % powder, Take 1 packet by mouth 3 (three) times daily., Disp: , Rfl:    rosuvastatin (CRESTOR) 5 MG tablet, TAKE 1 TABLET (5 MG TOTAL) BY MOUTH DAILY., Disp: 90 tablet, Rfl: 2   Medications ordered in this encounter:  Meds ordered this encounter  Medications   molnupiravir EUA (LAGEVRIO) 200 mg CAPS capsule    Sig: Take 4 capsules (800 mg total) by mouth 2 (two) times daily for 5 days.    Dispense:  40 capsule    Refill:  0    Order Specific Question:   Supervising Provider    Answer:   Sabra Heck, [place]     *If you need refills on other medications prior to your next appointment, please contact your pharmacy*  Follow-Up: Call back or seek an in-person evaluation if the symptoms worsen or if the condition fails to improve as anticipated.  Other Instructions 10 Things You Can Do to Manage Your COVID-19 Symptoms at Home If you have possible or  confirmed COVID-19 Stay home except to get medical care. Monitor your symptoms carefully. If your symptoms get worse, call your healthcare provider immediately. Get rest and stay hydrated. If you have a medical appointment, call the healthcare provider ahead of time and tell them that you have or may have COVID-19. For medical emergencies, call 911 and notify the dispatch personnel that you have or may have COVID-19. Cover your cough and sneezes with a tissue or use the inside of your elbow. Wash your hands often with soap and water for at least 20 seconds or clean your hands with an alcohol-based hand sanitizer that contains at least 60% alcohol. As much as possible, stay in a specific room and away from other people in your home. Also, you should use a separate bathroom, if available. If you need to be around other people in or outside of the home, wear a mask. Avoid sharing personal items with other people in your household, like dishes, towels, and bedding. Clean all surfaces that are touched often, like counters, tabletops, and doorknobs. Use household cleaning sprays or wipes according to the label instructions. [email or web address] 05/05/2020 This information is not intended to replace advice given to you by your health care provider. Make sure you discuss any questions you have with your health care provider. Document Revised: 06/29/2021 Document Reviewed: 06/29/2021 Elsevier Patient Education  2022 [place] Oral Capsules What is this medication? MOLNUPIRAVIR (mol nue pir a vir) treats COVID-19. It is an antiviral medication. It may decrease the risk of developing severe symptoms of COVID-19. It may also decrease the chance of going to the hospital. This medication is not approved by the FDA. The FDA has authorized emergency use of this medication during the COVID-19 pandemic. This medicine may be used for other purposes; ask your health care provider or pharmacist if  you have questions. COMMON BRAND NAME(S): LAGEVRIO What should I tell my care team before I take this medication? They need to know if you have any of these conditions: Any allergies Any serious illness An unusual or allergic reaction to molnupiravir, other medications, foods, dyes, or preservatives Pregnant or trying to get pregnant Breast-feeding How should I use this medication? Take this medication by mouth with water. Take it as directed on the prescription label at the same time every day. Do not cut, crush or chew this medication. Swallow the capsules whole. You can take it with or without food. If it upsets your stomach, take it with food. Take all of this medication unless your care team tells you to stop it early. Keep taking it even if you think you are better. Talk to your care team about the use of this medication in children. Special care may be needed. Overdosage: If you think you have taken too much of this medicine contact a poison control center or emergency room at once. NOTE: This medicine is only for you. Do not share this medicine with others. What if I miss a dose? If you miss a dose, take it as soon as you can unless it is more than 10 hours late. If it is more than 10 hours late, skip the missed dose. Take the next dose at the normal time. Do not take extra or 2 doses at the same time to make up for the missed dose. What may interact with this medication? Interactions have not been studied. This list may not describe all possible interactions. Give your health care provider a list of all the medicines, herbs, non-prescription drugs, or dietary supplements you use. Also tell them if you smoke, drink alcohol, or use illegal drugs. Some items may interact with your medicine. What should I watch for while using this medication? Your condition will be monitored carefully while you are receiving this medication. Visit your care team for regular checkups. Tell your care team if  your symptoms do not start to get better or if they get worse. Do not become pregnant while taking this medication. You may need a pregnancy test before starting this medication. Women must use a reliable form of birth control while taking this medication and for 4 days after stopping the medication. Women should inform their care team if they wish to become pregnant or think they might be pregnant. Men should not father a child while taking this medication and for 3 months after stopping it. There is potential for serious harm to an unborn child. Talk to your care team for more information. Do not breast-feed an infant while taking this medication and for 4 days after stopping the medication. What side effects may I notice from receiving this medication? Side effects that you should report to your care team as soon as possible: Allergic reactions--skin rash, itching, hives, swelling of the face, lips, tongue, or throat Side effects that usually do not require medical attention (report these  to your care team if they continue or are bothersome): Diarrhea Dizziness Nausea This list may not describe all possible side effects. Call your doctor for medical advice about side effects. You may report side effects to FDA at 1-800-FDA-1088. Where should I keep my medication? Keep out of the reach of children and pets. Store at room temperature between 20 and 25 degrees C (68 and 77 degrees F). Get rid of any unused medication after the expiration date. To get rid of medications that are no longer needed or have expired: Take the medication to a medication take-back program. Check with your pharmacy or law enforcement to find a location. If you cannot return the medication, check the label or package insert to see if the medication should be thrown out in the garbage or flushed down the toilet. If you are not sure, ask your care team. If it is safe to put it in the trash, take the medication out of the  container. Mix the medication with cat litter, dirt, coffee grounds, or other unwanted substance. Seal the mixture in a bag or container. Put it in the trash. NOTE: This sheet is a summary. It may not cover all possible information. If you have questions about this medicine, talk to your doctor, pharmacist, or health care provider.  2022 Elsevier/Gold Standard (2020-10-16 00:00:00)    If you have been instructed to have an in-person evaluation today at a local Urgent Care facility, please use the link below. It will take you to a list of all of our available [facility], including address, phone number and hours of operation. Please do not delay care.  [facility]  If you or a family member do not have a primary care provider, use the link below to schedule a visit and establish care. When you choose a [facility] primary care physician or advanced practice provider, you gain a long-term partner in health. Find a Primary Care Provider  Learn more about [facility]'s in-office and virtual care options: [facility]

## 2022-03-09 ENCOUNTER — Encounter: Payer: Self-pay | Admitting: Family Medicine

## 2022-03-09 DIAGNOSIS — Z9109 Other allergy status, other than to drugs and biological substances: Secondary | ICD-10-CM

## 2022-03-12 MED ORDER — FLUTICASONE PROPIONATE 50 MCG/ACT NA SUSP: 2.0000 | Freq: Every day | NASAL | 12 refills | Status: AC

## 2022-05-02 ENCOUNTER — Encounter (INDEPENDENT_AMBULATORY_CARE_PROVIDER_SITE_OTHER): Payer: BC Managed Care – PPO | Admitting: Family Medicine

## 2022-05-02 DIAGNOSIS — B070 Plantar wart: Secondary | ICD-10-CM

## 2022-05-15 ENCOUNTER — Encounter: Payer: Self-pay | Admitting: Family Medicine

## 2022-05-16 ENCOUNTER — Other Ambulatory Visit: Payer: Self-pay

## 2022-05-16 DIAGNOSIS — E7849 Other hyperlipidemia: Secondary | ICD-10-CM

## 2022-05-16 MED ORDER — ROSUVASTATIN CALCIUM 5 MG PO TABS: 5.0000 mg | ORAL_TABLET | Freq: Every day | ORAL | 2 refills | Status: DC

## 2022-06-06 NOTE — Progress Notes (Deleted)
Complete physical exam   Patient: Darryl Larson   DOB: [birth year]   61 y.o. Male  MRN: [id number] Visit Date: 06/10/2022  Today's healthcare provider: Wilhemena Durie, MD   No chief complaint on file.  Subjective    Darryl Larson is a 61 y.o. male who presents today for a complete physical exam.  He reports consuming a {diet types:17450} diet. {Exercise:19826} He generally feels {well/fairly well/poorly:18703}. He reports sleeping {well/fairly well/poorly:18703}. He {does/does not:200015} have additional problems to discuss today.  HPI  ***  Past Medical History:  Diagnosis Date   Acid reflux    ADD (attention deficit disorder)    Depression    Past Surgical History:  Procedure Laterality Date   VASECTOMY     Social History   Socioeconomic History   Marital status: Divorced    Spouse name: Not on file   Number of children: Not on file   Years of education: Not on file   Highest education level: Not on file  Occupational History   Not on file  Tobacco Use   Smoking status: Never   Smokeless tobacco: Former    Types: Chew  Vaping Use   Vaping Use: Never used  Substance and Sexual Activity   Alcohol use: Yes    Alcohol/week: 0.0 standard drinks of alcohol    Comment: 3 to 4 drinks a week   Drug use: No   Sexual activity: Yes    Birth control/protection: Surgical  Other Topics Concern   Not on file  Social History Narrative   Not on file   Social Determinants of Health   Financial Resource Strain: Not on file  Food Insecurity: Not on file  Transportation Needs: Not on file  Physical Activity: Not on file  Stress: Not on file  Social Connections: Not on file  Intimate Partner Violence: Not on file   Family Status  Relation Name Status   Mother  Deceased   Father  Deceased at age 55   Brother 65 Alive   Daughter  Alive   Psychiatrist  Deceased   Brother 2 Alive   Brother 3 Deceased       suicide   Field seismologist  (Not Specified)   Neg Hx  (Not  Specified)   Family History  Problem Relation Age of Onset   Breast cancer Mother    Diabetes Mother    Pancreatic cancer Mother    Throat cancer Father    Alcohol abuse Father    Depression Father    Healthy Brother    Healthy Daughter    Hodgkin's lymphoma Paternal Uncle    Depression Brother    Depression Brother    Cancer Paternal Aunt    Prostate cancer Neg Hx    No Known Allergies  Patient Care Team: [name]., MD as PCP - General (Family Medicine)   Medications: Outpatient Medications Prior to Visit  Medication Sig   Ascorbic Acid (VITAMIN C) 500 MG CHEW Take 500 mg by mouth daily.   CINNAMON PO Take by mouth daily.   fluticasone (FLONASE) 50 MCG/ACT nasal spray Place 2 sprays into both nostrils daily.   glucose blood test strip USE TO TEST BLOOD SUGAR LEVELS ONE TO TWO TIMES A WEEK   MULTIPLE VITAMIN PO Take by mouth.   psyllium (METAMUCIL) 58.6 % powder Take 1 packet by mouth 3 (three) times daily.   rosuvastatin (CRESTOR) 5 MG tablet Take 1 tablet (5  mg total) by mouth daily.   No facility-administered medications prior to visit.    Review of Systems  Constitutional: Negative.   HENT: Negative.    Eyes: Negative.   Respiratory: Negative.    Cardiovascular: Negative.   Gastrointestinal: Negative.   Endocrine: Negative.   Genitourinary: Negative.   Musculoskeletal: Negative.   Skin: Negative.   Allergic/Immunologic: Negative.   Neurological: Negative.   Hematological: Negative.   Psychiatric/Behavioral: Negative.      {Labs  Heme  Chem  Endocrine  Serology  Results Review (optional):23779}  Objective    There were no vitals taken for this visit. {Show previous vital signs (optional):23777}   Physical Exam Constitutional:      Appearance: Normal appearance. He is normal weight.  HENT:     Head: Normocephalic and atraumatic.     Right Ear: Tympanic membrane, ear canal and external ear normal.     Left Ear: Tympanic membrane,  ear canal and external ear normal.     Nose: Nose normal.     Mouth/Throat:     Mouth: Mucous membranes are moist.     Pharynx: Oropharynx is clear.  Eyes:     Extraocular Movements: Extraocular movements intact.     Conjunctiva/sclera: Conjunctivae normal.     Pupils: Pupils are equal, round, and reactive to light.  Cardiovascular:     Rate and Rhythm: Normal rate and regular rhythm.     Pulses: Normal pulses.     Heart sounds: Normal heart sounds.  Pulmonary:     Effort: Pulmonary effort is normal.     Breath sounds: Normal breath sounds.  Abdominal:     General: Abdomen is flat. Bowel sounds are normal.     Palpations: Abdomen is soft.  Musculoskeletal:        General: Normal range of motion.     Cervical back: Normal range of motion and neck supple.  Skin:    General: Skin is warm and dry.  Neurological:     General: No focal deficit present.     Mental Status: He is alert and oriented to person, place, and time. Mental status is at baseline.  Psychiatric:        Mood and Affect: Mood normal.        Behavior: Behavior normal.        Thought Content: Thought content normal.        Judgment: Judgment normal.     ***  Last depression screening scores    12/06/2021    8:25 AM 06/06/2021    9:23 AM 12/22/2019    3:41 PM  PHQ 2/9 Scores  PHQ - 2 Score 0 3 4  PHQ- 9 Score '3 7 12   '$ Last fall risk screening    12/06/2021    8:25 AM  Fall Risk   Falls in the past year? 0  Number falls in past yr: 0  Injury with Fall? 0  Risk for fall due to : No Fall Risks  Follow up Falls evaluation completed   Last Audit-C alcohol use screening    12/06/2021    8:24 AM  Alcohol Use Disorder Test (AUDIT)  1. How often do you have a drink containing alcohol? 4  2. How many drinks containing alcohol do you have on a typical day when you are drinking? 0  3. How often do you have six or more drinks on one occasion? 1  AUDIT-C Score 5  4. How often during the last year  have you found  that you were not able to stop drinking once you had started? 0  5. How often during the last year have you failed to do what was normally expected from you because of drinking? 0  6. How often during the last year have you needed a first drink in the morning to get yourself going after a heavy drinking session? 0  7. How often during the last year have you had a feeling of guilt of remorse after drinking? 0  8. How often during the last year have you been unable to remember what happened the night before because you had been drinking? 0  9. Have you or someone else been injured as a result of your drinking? 0  10. Has a relative or friend or a doctor or another health worker been concerned about your drinking or suggested you cut down? 0  Alcohol Use Disorder Identification Test Final Score (AUDIT) 5   A score of 3 or more in women, and 4 or more in men indicates increased risk for alcohol abuse, EXCEPT if all of the points are from question 1   No results found for any visits on 06/10/22.  Assessment & Plan    Routine Health Maintenance and Physical Exam  Exercise Activities and Dietary recommendations  Goals   None     Immunization History  Administered Date(s) Administered   Moderna Sars-Covid-2 Vaccination 01/05/2020, 02/02/2020   Pneumococcal Polysaccharide-23 12/02/2012   Tdap 11/27/2011   Zoster Recombinat (Shingrix) 11/20/2020    Health Maintenance  Topic Date Due   HIV Screening  Never done   COVID-19 Vaccine (3 - Moderna series) 03/29/2020   Zoster Vaccines- Shingrix (2 of 2) 01/15/2021   TETANUS/TDAP  11/26/2021   COLONOSCOPY (Pts 45-55yr Insurance coverage will need to be confirmed)  06/04/2022   INFLUENZA VACCINE  05/21/2022   Hepatitis C Screening  Addressed   HPV VACCINES  Aged Out    Discussed health benefits of physical activity, and encouraged him to engage in regular exercise appropriate for his age and condition.  ***  No follow-ups on file.      {provider attestation***:1}   RWilhemena Durie MD  BB[id number] [phone](phone) [phone](fax)  [facility]

## 2022-06-09 ENCOUNTER — Other Ambulatory Visit: Payer: Self-pay | Admitting: Family Medicine

## 2022-06-09 DIAGNOSIS — E7849 Other hyperlipidemia: Secondary | ICD-10-CM

## 2022-06-10 ENCOUNTER — Encounter: Payer: BC Managed Care – PPO | Admitting: Family Medicine

## 2022-06-10 DIAGNOSIS — Z0000 Encounter for general adult medical examination without abnormal findings: Secondary | ICD-10-CM

## 2022-06-10 DIAGNOSIS — Z125 Encounter for screening for malignant neoplasm of prostate: Secondary | ICD-10-CM

## 2022-06-13 ENCOUNTER — Ambulatory Visit (INDEPENDENT_AMBULATORY_CARE_PROVIDER_SITE_OTHER): Payer: BC Managed Care – PPO | Admitting: Family Medicine

## 2022-06-13 ENCOUNTER — Other Ambulatory Visit: Payer: Self-pay | Admitting: *Deleted

## 2022-06-13 ENCOUNTER — Encounter: Payer: Self-pay | Admitting: Family Medicine

## 2022-06-13 VITALS — BP 118/64 | HR 48 | Temp 97.8°F | Resp 16 | Ht 65.0 in | Wt 164.3 lb

## 2022-06-13 DIAGNOSIS — Z125 Encounter for screening for malignant neoplasm of prostate: Secondary | ICD-10-CM

## 2022-06-13 DIAGNOSIS — K219 Gastro-esophageal reflux disease without esophagitis: Secondary | ICD-10-CM

## 2022-06-13 DIAGNOSIS — L570 Actinic keratosis: Secondary | ICD-10-CM

## 2022-06-13 DIAGNOSIS — Z23 Encounter for immunization: Secondary | ICD-10-CM | POA: Diagnosis not present

## 2022-06-13 DIAGNOSIS — E7849 Other hyperlipidemia: Secondary | ICD-10-CM | POA: Diagnosis not present

## 2022-06-13 DIAGNOSIS — R7303 Prediabetes: Secondary | ICD-10-CM | POA: Diagnosis not present

## 2022-06-13 DIAGNOSIS — Z9109 Other allergy status, other than to drugs and biological substances: Secondary | ICD-10-CM

## 2022-06-13 DIAGNOSIS — Z0000 Encounter for general adult medical examination without abnormal findings: Secondary | ICD-10-CM

## 2022-06-13 NOTE — Progress Notes (Signed)
I,Sulibeya S Dimas,acting as a scribe for Wilhemena Durie, MD.,have documented all relevant documentation on the behalf of Wilhemena Durie, MD,as directed by  Wilhemena Durie, MD while in the presence of Wilhemena Durie, MD.   Complete physical exam   Patient: Darryl Larson   DOB: 03/16/1961   61 y.o. Male  MRN: [id number] Visit Date: 06/13/2022  Today's healthcare provider: Wilhemena Durie, MD   Chief Complaint  Patient presents with   Annual Exam   Subjective    Darryl Larson is a 61 y.o. male who presents today for a complete physical exam.  He reports consuming a general diet. The patient has a physically strenuous job, but has no regular exercise apart from work.  He generally feels well. He reports sleeping fairly well. Patient reports using CPAP every night. He does not have additional problems to discuss today.  HPI    Past Medical History:  Diagnosis Date   Acid reflux    ADD (attention deficit disorder)    Depression    Past Surgical History:  Procedure Laterality Date   EYE SURGERY Bilateral    VASECTOMY     Social History   Socioeconomic History   Marital status: Divorced    Spouse name: Not on file   Number of children: Not on file   Years of education: Not on file   Highest education level: Not on file  Occupational History   Not on file  Tobacco Use   Smoking status: Never   Smokeless tobacco: Former    Types: Nurse, children's Use: Never used  Substance and Sexual Activity   Alcohol use: Yes    Alcohol/week: 0.0 standard drinks of alcohol    Comment: 3 to 4 drinks a week   Drug use: No   Sexual activity: Yes    Birth control/protection: Surgical  Other Topics Concern   Not on file  Social History Narrative   Not on file   Social Determinants of Health   Financial Resource Strain: Not on file  Food Insecurity: Not on file  Transportation Needs: Not on file  Physical Activity: Not on file  Stress: Not on  file  Social Connections: Not on file  Intimate Partner Violence: Not on file   Family Status  Relation Name Status   Mother  Deceased   Father  Deceased at age 55   Brother 32 Alive   Daughter  Alive   Psychiatrist  Deceased   Brother 2 Alive   Brother 3 Deceased       suicide   Field seismologist  (Not Specified)   Neg Hx  (Not Specified)   Family History  Problem Relation Age of Onset   Breast cancer Mother    Diabetes Mother    Pancreatic cancer Mother    Throat cancer Father    Alcohol abuse Father    Depression Father    Healthy Brother    Healthy Daughter    Hodgkin's lymphoma Paternal Uncle    Depression Brother    Depression Brother    Cancer Paternal Aunt    Prostate cancer Neg Hx    No Known Allergies  Patient Care Team: [name]., MD as PCP - General (Family Medicine)   Medications: Outpatient Medications Prior to Visit  Medication Sig   Ascorbic Acid (VITAMIN C) 500 MG CHEW Take 500 mg by mouth daily.   brimonidine (ALPHAGAN) 0.2 %  ophthalmic solution Place 1 drop into the right eye 3 (three) times daily.   CINNAMON PO Take by mouth daily.   fluticasone (FLONASE) 50 MCG/ACT nasal spray Place 2 sprays into both nostrils daily.   glucose blood test strip USE TO TEST BLOOD SUGAR LEVELS ONE TO TWO TIMES A WEEK   MULTIPLE VITAMIN PO Take by mouth.   psyllium (METAMUCIL) 58.6 % powder Take 1 packet by mouth 3 (three) times daily.   rosuvastatin (CRESTOR) 5 MG tablet TAKE 1 TABLET (5 MG TOTAL) BY MOUTH DAILY.   No facility-administered medications prior to visit.    Review of Systems  Skin:  Positive for color change.  All other systems reviewed and are negative.   Last CBC Lab Results  Component Value Date   WBC 4.2 06/07/2021   HGB 15.7 06/07/2021   HCT 45.5 06/07/2021   MCV 88 06/07/2021   MCH 30.5 06/07/2021   RDW 11.9 06/07/2021   PLT 186 16/07/9603   Last metabolic panel Lab Results  Component Value Date   GLUCOSE 116 (H) 06/07/2021    NA 141 06/07/2021   K 4.6 06/07/2021   CL 101 06/07/2021   CO2 26 06/07/2021   BUN 20 06/07/2021   CREATININE 0.85 06/07/2021   EGFR 99 06/07/2021   CALCIUM 9.3 06/07/2021   PROT 6.3 06/07/2021   ALBUMIN 4.3 06/07/2021   LABGLOB 2.0 06/07/2021   AGRATIO 2.2 06/07/2021   BILITOT 0.3 06/07/2021   ALKPHOS 59 06/07/2021   AST 26 06/07/2021   ALT 31 06/07/2021   Last lipids Lab Results  Component Value Date   CHOL 148 06/07/2021   HDL 45 06/07/2021   LDLCALC 88 06/07/2021   TRIG 77 06/07/2021   CHOLHDL 3.3 06/07/2021   Last hemoglobin A1c Lab Results  Component Value Date   HGBA1C 6.2 (H) 12/06/2021   Last thyroid functions Lab Results  Component Value Date   TSH 1.070 06/07/2021      Objective     BP 118/64 (BP Location: Right Arm, Patient Position: Sitting, Cuff Size: Large)   Pulse (!) 48   Temp 97.8 F (36.6 C) (Oral)   Resp 16   Ht 5' 5"  (1.651 m)   Wt 164 lb 4.8 oz (74.5 kg)   SpO2 99%   BMI 27.34 kg/m  BP Readings from Last 3 Encounters:  06/13/22 118/64  12/06/21 129/75  11/21/21 (!) 162/77   Wt Readings from Last 3 Encounters:  06/13/22 164 lb 4.8 oz (74.5 kg)  12/06/21 161 lb 9.6 oz (73.3 kg)  11/21/21 160 lb 6.4 oz (72.8 kg)       Physical Exam Vitals reviewed.  Constitutional:      Appearance: Normal appearance. He is well-developed.  HENT:     Head: Normocephalic and atraumatic.     Right Ear: Tympanic membrane and external ear normal.     Left Ear: Tympanic membrane and external ear normal.     Nose: Nose normal.  Eyes:     General: No scleral icterus.    Conjunctiva/sclera: Conjunctivae normal.  Neck:     Thyroid: No thyromegaly.  Cardiovascular:     Rate and Rhythm: Normal rate and regular rhythm.     Heart sounds: Normal heart sounds.  Pulmonary:     Effort: Pulmonary effort is normal.     Breath sounds: Normal breath sounds.  Abdominal:     Palpations: Abdomen is soft.  Genitourinary:    Penis: Normal.  Testes:  Normal.  Lymphadenopathy:     Cervical: No cervical adenopathy.  Skin:    General: Skin is warm and dry.  Neurological:     General: No focal deficit present.     Mental Status: He is alert and oriented to person, place, and time.  Psychiatric:        Mood and Affect: Mood normal.        Behavior: Behavior normal.        Thought Content: Thought content normal.        Judgment: Judgment normal.       Last depression screening scores    06/13/2022    9:56 AM 12/06/2021    8:25 AM 06/06/2021    9:23 AM  PHQ 2/9 Scores  PHQ - 2 Score 2 0 3  PHQ- 9 Score 5 3 7    Last fall risk screening    06/13/2022    9:55 AM  Fall Risk   Falls in the past year? 0  Number falls in past yr: 0  Injury with Fall? 0  Risk for fall due to : No Fall Risks  Follow up Falls evaluation completed   Last Audit-C alcohol use screening    06/13/2022    9:57 AM  Alcohol Use Disorder Test (AUDIT)  1. How often do you have a drink containing alcohol? 3  2. How many drinks containing alcohol do you have on a typical day when you are drinking? 0  3. How often do you have six or more drinks on one occasion? 0  AUDIT-C Score 3  4. How often during the last year have you found that you were not able to stop drinking once you had started? 0  5. How often during the last year have you failed to do what was normally expected from you because of drinking? 0  6. How often during the last year have you needed a first drink in the morning to get yourself going after a heavy drinking session? 0  7. How often during the last year have you had a feeling of guilt of remorse after drinking? 0  8. How often during the last year have you been unable to remember what happened the night before because you had been drinking? 0  9. Have you or someone else been injured as a result of your drinking? 0  10. Has a relative or friend or a doctor or another health worker been concerned about your drinking or suggested you cut  down? 0  Alcohol Use Disorder Identification Test Final Score (AUDIT) 3   A score of 3 or more in women, and 4 or more in men indicates increased risk for alcohol abuse, EXCEPT if all of the points are from question 1   No results found for any visits on 06/13/22.  Assessment & Plan    Routine Health Maintenance and Physical Exam  Exercise Activities and Dietary recommendations  Goals   None     Immunization History  Administered Date(s) Administered   Moderna Sars-Covid-2 Vaccination 01/05/2020, 02/02/2020   Pneumococcal Polysaccharide-23 12/02/2012   Tdap 11/27/2011   Zoster Recombinat (Shingrix) 11/20/2020    Health Maintenance  Topic Date Due   HIV Screening  Never done   COVID-19 Vaccine (3 - Moderna series) 03/29/2020   Zoster Vaccines- Shingrix (2 of 2) 01/15/2021   TETANUS/TDAP  11/26/2021   COLONOSCOPY (Pts 45-64yr Insurance coverage will need to be confirmed)  06/04/2022   INFLUENZA VACCINE  05/21/2022   Hepatitis C Screening  Addressed   HPV VACCINES  Aged Out    Discussed health benefits of physical activity, and encouraged him to engage in regular exercise appropriate for his age and condition.  1. Annual physical exam  - Hemoglobin A1c - CBC w/Diff/Platelet - Comprehensive Metabolic Panel (CMET) - TSH - Lipid panel  2. Other hyperlipidemia  - Hemoglobin A1c - CBC w/Diff/Platelet - Comprehensive Metabolic Panel (CMET) - TSH - Lipid panel  3. Borderline diabetes  - Hemoglobin A1c - CBC w/Diff/Platelet - Comprehensive Metabolic Panel (CMET) - TSH - Lipid panel  4. Environmental allergies  - Hemoglobin A1c - CBC w/Diff/Platelet - Comprehensive Metabolic Panel (CMET) - TSH - Lipid panel  5. Gastro-esophageal reflux disease without esophagitis  - Hemoglobin A1c - CBC w/Diff/Platelet - Comprehensive Metabolic Panel (CMET) - TSH - Lipid panel  6. Prostate cancer screening - PSA  7. Need for Td vaccine  - Td vaccine greater than  or equal to 7yo preservative free IM  8. AK (actinic keratosis)  - Ambulatory referral to Dermatology   No follow-ups on file.     I, Wilhemena Durie, MD, have reviewed all documentation for this visit. The documentation on 06/16/22 for the exam, diagnosis, procedures, and orders are all accurate and complete.    Nyasia Baxley Cranford Mon, MD  [facility] [phone] (phone) [phone] (fax)  [place]

## 2022-06-13 NOTE — Progress Notes (Signed)
[facility] [address] [place]. [place] [address] [phone]       OFFICE FOLLOW UP NOTE  Mr. ALBINO BUFFORD Date of Birth:  01/05/1961 Medical Record Number:  [id number]    Primary neurologist: Dr. Brett Fairy Reason for visit: Initial CPAP compliance visit    SUBJECTIVE:   CHIEF COMPLAINT:  Chief Complaint  Patient presents with   Obstructive Sleep Apnea    Pt reports being okay. He has questions regarding his CPAP. Room 2 alone    HPI:   Update 06/17/2022 JM: Completed HST 05/02/2021 which showed REM dependent moderate sleep apnea with total AHI 17.6/h and REM AHI 30.7/h, no associated hypoxemia.  Recommend initiating AutoPap which was started back in November but apparently machine fell off bedside table and obtained a new machine on 04/12/2022.  He has been tolerating CPAP machine well with some improvement of sleep and energy levels but does still have moderate sleepiness and fatigue but he questions if this residual fatigue is from getting usually about 6 hours of sleep per night.    ESS 14/24 (prior to CPAP 19/24) FSS 40/63 (prior to CPAP 37/63)               History provided for reference purposes only Consult visit 04/05/2021 Dr. Brett Fairy: KAMRON VANWYHE is a 61 y.o. year old  Caucasian male patient seen here upon referral on 04/05/2021 from Dr. Rosanna Randy,  for a sleep consultation.  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  His primary care physician did not specify what his sleep disturbances.  While his sleep disturbance is that he is excessively daytime sleepy he endorsed the Epworth Sleepiness Scale at 19 points the fatigue severity only at 37 points which is around medium range.  He does not feel excessively depressed or fatigued but he can go to sleep with little provocation.  He also describes that his friends seem never to eat as  much sleep as he does.  Reading is especially dangerous to provoke sleeping but just being an active resting anywhere without being physically active or mentally stimulated is almost a guarantee for him to fall asleep.  He does not think that his nocturnal sleep is poor. He does not remember that he dreams at night or if he dreams at night and he does not have a frequent bathroom breaks -he just wakes up spontaneously.   CLEOFAS HUDGINS  has a past medical history of Acid reflux, ADD (attention deficit disorder), HYPERLIPIDEMIA and Depression. He cn be tired all day long and still has some days problems to go to sleep. Averages 5-6 hours of sleep.  Bedtime  between 10.30- 6 AM.      Sleep relevant medical history:  Family  history:  no hypersomnia.   Social history:  Patient is retired from [employer], [employer] middle school.   and lives in a household alone, one dog and one cat . Adult children.  Tobacco use; former chewing .   ETOH use 2 light beers some nights. , Caffeine intake in form of Coffee( in AM ) Soda( 1 every other day) . Regular exercise in form of PE, physical labour.         Sleep habits are as follows: The patient's dinner time is between 7-8 PM. The patient goes to bed at 10-11 PM and continues to sleep for intervals  of 1-2 hours, wakes for unknown reasons- 4-5 times -  The preferred sleep position is sides , with the support of 1 memory-contour  pillow.  Dreams are reportedly rare.   AM is the usual rise time.  The patient wakes up with an alarm.  He reports not feeling refreshed or restored in AM. Naps are taken frequently, lasting from 15-30  minutes and are more refreshing  for at least 2 hours.           ROS:   14 system review of systems performed and negative with exception of those listed in HPI  PMH:  Past Medical History:  Diagnosis Date   Acid reflux    ADD (attention deficit disorder)    Depression     PSH:  Past Surgical History:   Procedure Laterality Date   EYE SURGERY Bilateral    VASECTOMY      Social History:  Social History   Socioeconomic History   Marital status: Divorced    Spouse name: Not on file   Number of children: Not on file   Years of education: Not on file   Highest education level: Not on file  Occupational History   Not on file  Tobacco Use   Smoking status: Never   Smokeless tobacco: Former    Types: Nurse, children's Use: Never used  Substance and Sexual Activity   Alcohol use: Yes    Alcohol/week: 0.0 standard drinks of alcohol    Comment: 3 to 4 drinks a week   Drug use: No   Sexual activity: Yes    Birth control/protection: Surgical  Other Topics Concern   Not on file  Social History Narrative   Not on file   Social Determinants of Health   Financial Resource Strain: Not on file  Food Insecurity: Not on file  Transportation Needs: Not on file  Physical Activity: Not on file  Stress: Not on file  Social Connections: Not on file  Intimate Partner Violence: Not on file    Family History:  Family History  Problem Relation Age of Onset   Breast cancer Mother    Diabetes Mother    Pancreatic cancer Mother    Throat cancer Father    Alcohol abuse Father    Depression Father    Healthy Brother    Healthy Daughter    Hodgkin's lymphoma Paternal Uncle    Depression Brother    Depression Brother    Cancer Paternal Aunt    Prostate cancer Neg Hx     Medications:   Current Outpatient Medications on File Prior to Visit  Medication Sig Dispense Refill   brimonidine (ALPHAGAN) 0.2 % ophthalmic solution Place 1 drop into the right eye 3 (three) times daily.     CINNAMON PO Take by mouth daily.     fluticasone (FLONASE) 50 MCG/ACT nasal spray Place 2 sprays into both nostrils daily. 48 mL 12   GARLIC 6283 PO Take by mouth.     glucose blood test strip USE TO TEST BLOOD SUGAR LEVELS ONE TO TWO TIMES A WEEK 100 each 12   MULTIPLE VITAMIN PO Take by mouth.      rosuvastatin (CRESTOR) 5 MG tablet TAKE 1 TABLET (5 MG TOTAL) BY MOUTH DAILY. 90 tablet 2   Zinc 100 MG TABS Take by mouth.     Ascorbic Acid (VITAMIN C) 500 MG CHEW Take 500 mg by mouth daily. (Patient not taking: Reported on 06/17/2022)  psyllium (METAMUCIL) 58.6 % powder Take 1 packet by mouth 3 (three) times daily. (Patient not taking: Reported on 06/17/2022)     No current facility-administered medications on file prior to visit.    Allergies:  No Known Allergies    OBJECTIVE:  Physical Exam  Vitals:   06/17/22 1539  BP: 138/66  Pulse: (!) 55  Weight: 164 lb (74.4 kg)  Height: '5\' 5"'$  (1.651 m)   Body mass index is 27.29 kg/m. No results found.   General: well developed, well nourished, very pleasant middle-age Caucasian male, seated, in no evident distress HEENT: head normocephalic and atraumatic.  Neck circumference 16".  Mallampati 1.  Cardiovascular: regular rate and rhythm, no murmurs Musculoskeletal: no deformity Skin:  no rash/petichiae Vascular:  Normal pulses all extremities   Neurologic Exam Mental Status: Awake and fully alert. Oriented to place and time. Recent and remote memory intact. Attention span, concentration and fund of knowledge appropriate. Mood and affect appropriate.  Cranial Nerves: Pupils equal, briskly reactive to light. Extraocular movements full without nystagmus. Visual fields full to confrontation. Hearing intact. Facial sensation intact. Face, tongue, palate moves normally and symmetrically.  Motor: Normal bulk and tone. Normal strength in all tested extremity muscles Sensory.: intact to touch , pinprick , position and vibratory sensation.  Coordination: Rapid alternating movements normal in all extremities. Finger-to-nose and heel-to-shin performed accurately bilaterally. Gait and Station: Arises from chair without difficulty. Stance is normal. Gait demonstrates normal stride length and balance without use of AD. Tandem walk and heel  toe without difficulty.  Reflexes: 1+ and symmetric. Toes downgoing.         ASSESSMENT/PLAN: TRUMAN ACEITUNO is a 61 y.o. year old male   1.  OSA on CPAP  Compliance report shows satisfactory usage with optimal residual AHI.  Discussed continued nightly usage with ensuring greater than 4 hours nightly for optimal benefit and per insurance purposes.  Continue to follow with DME company for any needed supplies or CPAP related concerns  2.  Excessive daytime fatigue 3.  Narcolepsy gene positive  As he continues to experience excessive daytime fatigue despite use of CPAP since 08/2021, will proceed with MLST at this time    Follow up with Dr. Brett Fairy after completion of MLST to further discuss treatment options    CC:  PCP: [name]., MD    I spent 24 minutes of face-to-face and non-face-to-face time with patient.  This included previsit chart review, lab review, study review, order entry, electronic health record documentation, patient education and discussion regarding the above diagnoses with further treatment plan and answered all the questions to patient's satisfaction   Frann Rider, [facility]  [facility] [address] [place], [place] [address]  Phone [phone] Fax [phone] Note: This document was prepared with digital dictation and possible smart phrase technology. Any transcriptional errors that result from this process are unintentional.

## 2022-06-14 LAB — CBC WITH DIFFERENTIAL/PLATELET
Basophils Absolute: 0.1 10*3/uL (ref 0.0–0.2)
Basos: 2 %
EOS (ABSOLUTE): 0.1 10*3/uL (ref 0.0–0.4)
Eos: 2 %
Hematocrit: 47.5 % (ref 37.5–51.0)
Hemoglobin: 15.8 g/dL (ref 13.0–17.7)
Immature Grans (Abs): 0 10*3/uL (ref 0.0–0.1)
Immature Granulocytes: 0 %
Lymphocytes Absolute: 1 10*3/uL (ref 0.7–3.1)
Lymphs: 22 %
MCH: 30 pg (ref 26.6–33.0)
MCHC: 33.3 g/dL (ref 31.5–35.7)
MCV: 90 fL (ref 79–97)
Monocytes Absolute: 0.5 10*3/uL (ref 0.1–0.9)
Monocytes: 12 %
Neutrophils Absolute: 2.7 10*3/uL (ref 1.4–7.0)
Neutrophils: 62 %
Platelets: 240 10*3/uL (ref 150–450)
RBC: 5.26 x10E6/uL (ref 4.14–5.80)
RDW: 12.2 % (ref 11.6–15.4)
WBC: 4.4 10*3/uL (ref 3.4–10.8)

## 2022-06-14 LAB — COMPREHENSIVE METABOLIC PANEL
ALT: 33 IU/L (ref 0–44)
AST: 25 IU/L (ref 0–40)
Albumin/Globulin Ratio: 2 (ref 1.2–2.2)
Albumin: 4.5 g/dL (ref 3.9–4.9)
Alkaline Phosphatase: 71 IU/L (ref 44–121)
BUN/Creatinine Ratio: 18 (ref 10–24)
BUN: 14 mg/dL (ref 8–27)
Bilirubin Total: 0.5 mg/dL (ref 0.0–1.2)
CO2: 24 mmol/L (ref 20–29)
Calcium: 9.4 mg/dL (ref 8.6–10.2)
Chloride: 102 mmol/L (ref 96–106)
Creatinine, Ser: 0.79 mg/dL (ref 0.76–1.27)
Globulin, Total: 2.2 g/dL (ref 1.5–4.5)
Glucose: 112 mg/dL — ABNORMAL HIGH (ref 70–99)
Potassium: 4.2 mmol/L (ref 3.5–5.2)
Sodium: 141 mmol/L (ref 134–144)
Total Protein: 6.7 g/dL (ref 6.0–8.5)
eGFR: 101 mL/min/{1.73_m2} (ref >59)

## 2022-06-14 LAB — PSA: Prostate Specific Ag, Serum: 0.7 ng/mL (ref 0.0–4.0)

## 2022-06-14 LAB — HEMOGLOBIN A1C
Est. average glucose Bld gHb Est-mCnc: 131 mg/dL
Hgb A1c MFr Bld: 6.2 % — ABNORMAL HIGH (ref 4.8–5.6)

## 2022-06-14 LAB — LIPID PANEL
Chol/HDL Ratio: 3.3 ratio (ref 0.0–5.0)
Cholesterol, Total: 154 mg/dL (ref 100–199)
HDL: 46 mg/dL (ref >39)
LDL Chol Calc (NIH): 95 mg/dL (ref 0–99)
Triglycerides: 67 mg/dL (ref 0–149)
VLDL Cholesterol Cal: 13 mg/dL (ref 5–40)

## 2022-06-14 LAB — TSH: TSH: 0.798 u[IU]/mL (ref 0.450–4.500)

## 2022-06-17 ENCOUNTER — Ambulatory Visit: Payer: BC Managed Care – PPO | Admitting: Adult Health

## 2022-06-17 ENCOUNTER — Encounter: Payer: Self-pay | Admitting: Adult Health

## 2022-06-17 VITALS — BP 138/66 | HR 55 | Ht 65.0 in | Wt 164.0 lb

## 2022-06-17 DIAGNOSIS — Z9989 Dependence on other enabling machines and devices: Secondary | ICD-10-CM

## 2022-06-17 DIAGNOSIS — G4719 Other hypersomnia: Secondary | ICD-10-CM

## 2022-06-17 DIAGNOSIS — R442 Other hallucinations: Secondary | ICD-10-CM | POA: Diagnosis not present

## 2022-06-17 DIAGNOSIS — G4733 Obstructive sleep apnea (adult) (pediatric): Secondary | ICD-10-CM

## 2022-06-17 NOTE — Patient Instructions (Addendum)
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 Brett Fairy after completion of MLST       Narcolepsy Narcolepsy is a neurological disorder that causes people to fall asleep suddenly and without control (have sleep attacks) during the daytime. It is a lifelong disorder. Narcolepsy disrupts the sleep cycle at night, which then causes daytime sleepiness. What are the causes? The cause of narcolepsy is not fully understood, but it may be related to: Low levels of hypocretin, a chemical (neurotransmitter) in the brain that controls sleep and wake cycles. Hypocretin imbalance may be caused by: Abnormal genes that are passed from parent to child (inherited). An autoimmune disease in which the body's defense system (immune system) attacks the brain cells that make hypocretin. Infection, tumor, or injury in the area of the brain that controls sleep. Exposure to poisons (toxins), such as heavy metals, pesticides, and secondhand smoke. What are the signs or symptoms? Symptoms of this condition include: Excessive daytime sleepiness. This is the most common symptom and is usually the first symptom you will notice. This may affect your performance at work or school. Sleep attacks. You may fall asleep in the middle of an activity, especially low-energy activities like reading or watching TV. Feeling like you cannot think clearly and trouble focusing or remembering things. You may also feel depressed. Sudden muscle weakness (cataplexy). When this occurs, your speech may become slurred, or your knees may buckle. Cataplexy is usually triggered by surprise, anger, fear, or laughter. Losing the ability to speak or move (sleep paralysis). This may occur just as you start to fall asleep or wake up. You will be aware of the paralysis. It usually lasts for just a few seconds or  minutes. Seeing, hearing, tasting, smelling, or feeling things that are not real (hallucinations). Hallucinations may occur with sleep paralysis. They can happen when you are falling asleep, waking up, or dozing. Trouble staying asleep at night (insomnia) and restless sleep. How is this diagnosed? This condition may be diagnosed based on: A physical exam to rule out any other problems that may be causing your symptoms. You may be asked to write down your sleeping patterns for several weeks in a sleep diary. This will help your health care provider make a diagnosis. Sleep studies that measure how well your REM sleep is regulated. These tests also measure your heart rate, breathing, movement, and brain waves. These tests include: An overnight sleep study (polysomnogram). A daytime sleep study that is done while you take several naps during the day (multiple sleep latency test, MSLT). This test measures how quickly you fall asleep and how quickly you enter REM sleep. Removal of spinal fluid to measure hypocretin levels. How is this treated? There is no cure for this condition, but treatment can help relieve symptoms. Treatment may include: Lifestyle and sleeping strategies to help you cope with the condition, such as: Exercising regularly. Maintaining a regular sleep schedule. Avoiding caffeine and large meals before bed. Medicines. These may include: Medicines that help keep you awake and alert (stimulants) to fight daytime sleepiness. Medicines that treat depression (antidepressants). These may be used to treat cataplexy. Sodium oxybate. This is a strong medicine to help you relax (sedative) that you may take at night. It can help control daytime sleepiness and cataplexy. Other treatments may include mental health counseling or joining a support group. Follow  these instructions at home: Sleeping habits  Get about 8 hours of sleep every night. Go to sleep and get up at about the same time  every day. Keep your bedroom dark, quiet, and comfortable. When you feel very tired, take short naps. Schedule naps so that you take them at about the same time every day. Before bedtime: Avoid bright lights and screens. Relax. Try activities like reading or taking a warm bath. Activity Get at least 20 minutes of exercise every day. This will help you sleep better at night and reduce daytime sleepiness. Avoid exercising within 3 hours of bedtime. Do not drive or use heavy machinery if you are sleepy. If possible, take a nap before driving. Do not swim or go out on the water without a life jacket. Eating and drinking Do not drink alcohol or caffeinated beverages within 4-5 hours of bedtime. Do not eat a large meal before bedtime. Eat meals at about the same times every day. General instructions  Take over-the-counter and prescription medicines only as told by your health care provider. Keep a sleep diary as told by your health care provider. Tell your employer or teachers that you have narcolepsy. You may be able to adjust your schedule to include time for naps. Do not use any products that contain nicotine or tobacco, such as cigarettes, e-cigarettes, and chewing tobacco. If you need help quitting, ask your health care provider. Keep all follow-up visits as told by your health care provider. This is important. Where to find more information [employer] of Neurological Disorders: [employer]Box[employer] Contact a health care provider if: Your symptoms are not getting better. You have increasingly high blood pressure (hypertension). You have changes in your heart rhythm. You are having a hard time determining what is real and what is not (psychosis). Get help right away if you: Hurt yourself during a sleep attack or an attack of cataplexy. Have chest pain. Have trouble breathing. These symptoms may represent a serious problem that is an emergency. Do not wait to see if the symptoms  will go away. Get medical help right away. Call your local emergency services (911 in the [place] Do not drive yourself to the hospital. Summary Narcolepsy is a neurological disorder that causes people to fall asleep suddenly, and without control, during the daytime (sleep attacks). It is a lifelong disorder. There is no cure for this condition, but treatment can help relieve symptoms. Go to sleep and get up at about the same time every day. Follow instructions about sleep and activities as told by your health care provider. Take over-the-counter and prescription medicines only as told by your health care provider. This information is not intended to replace advice given to you by your health care provider. Make sure you discuss any questions you have with your health care provider. Document Revised: 11/12/2021 Document Reviewed: 05/19/2019 Elsevier Patient Education  [place].

## 2022-06-18 ENCOUNTER — Telehealth: Payer: Self-pay | Admitting: Adult Health

## 2022-06-18 NOTE — Addendum Note (Signed)
Addended by: Frann Rider L on: 06/18/2022 08:32 AM   Modules accepted: Orders

## 2022-06-18 NOTE — Telephone Encounter (Signed)
I need a NPSG order to go along with the MSLT order. When you get a chance can you order one. Thank you.

## 2022-06-18 NOTE — Telephone Encounter (Signed)
Order placed as requested.  Thank you. 

## 2022-07-01 ENCOUNTER — Other Ambulatory Visit (INDEPENDENT_AMBULATORY_CARE_PROVIDER_SITE_OTHER): Payer: Self-pay | Admitting: Ophthalmology

## 2022-07-10 NOTE — Progress Notes (Signed)
-----   Message -----  From: Trinidad Curet, CMA  Sent: 06/17/2022   4:46 PM EDT  To: Orrin Brigham Hepler  Subject: CPAP Order                                     New orders have been placed for the above pt, DOB: [birth year]  Thanks

## 2022-07-16 NOTE — Telephone Encounter (Signed)
Noted, thank you

## 2022-09-03 ENCOUNTER — Telehealth: Payer: Self-pay | Admitting: *Deleted

## 2022-09-03 DIAGNOSIS — Z1211 Encounter for screening for malignant neoplasm of colon: Secondary | ICD-10-CM

## 2022-09-03 NOTE — Telephone Encounter (Unsigned)
Copied from [place] [id number]. Topic: General - Other >> Sep 03, 2022 12:00 PM Leitha Schuller wrote: Reason for CRM: Pt requesting a cb from [facility]

## 2022-09-04 NOTE — Telephone Encounter (Signed)
Okay to refer to GI?

## 2022-09-04 NOTE — Telephone Encounter (Signed)
Wants to request Dr. Allen Norris at [place]

## 2022-09-04 NOTE — Telephone Encounter (Signed)
Copied from [place] [id number]. Topic: General - Other >> Sep 04, 2022  9:38 AM Sabas Sous wrote: Reason for CRM: Pt wants to have his colonoscopy orders transferred to [place] [facility] [place], they can see him before the end of the year. Patient wants this done this year for insurance purposes.

## 2022-09-11 ENCOUNTER — Telehealth: Payer: Self-pay

## 2022-09-11 ENCOUNTER — Other Ambulatory Visit: Payer: Self-pay

## 2022-09-11 DIAGNOSIS — Z86010 Personal history of colonic polyps: Secondary | ICD-10-CM

## 2022-09-11 MED ORDER — NA SULFATE-K SULFATE-MG SULF 17.5-3.13-1.6 GM/177ML PO SOLN: 1.0000 | Freq: Once | ORAL | 0 refills | Status: AC

## 2022-09-11 NOTE — Telephone Encounter (Signed)
Gastroenterology Pre-Procedure Review  Request Date: 09/27/22 Requesting Physician: Dr. Allen Norris  PATIENT REVIEW QUESTIONS: The patient responded to the following health history questions as indicated:    1. Are you having any GI issues? no 2. Do you have a personal history of Polyps? yes (2018 colonoscopy performed at [facility] ) 3. Do you have a family history of Colon Cancer or Polyps? no 4. Diabetes Mellitus? no 5. Joint replacements in the past 12 months?no 6. Major health problems in the past 3 months?no 7. Any artificial heart valves, MVP, or defibrillator?no    MEDICATIONS & ALLERGIES:    Patient reports the following regarding taking any anticoagulation/antiplatelet therapy:   Plavix, Coumadin, Eliquis, Xarelto, Lovenox, Pradaxa, Brilinta, or Effient? no Aspirin? no  Patient confirms/reports the following medications:  Current Outpatient Medications  Medication Sig Dispense Refill   Ascorbic Acid (VITAMIN C) 500 MG CHEW Take 500 mg by mouth daily. (Patient not taking: Reported on 06/17/2022)     brimonidine (ALPHAGAN) 0.2 % ophthalmic solution Place 1 drop into the right eye 3 (three) times daily.     CINNAMON PO Take by mouth daily.     fluticasone (FLONASE) 50 MCG/ACT nasal spray Place 2 sprays into both nostrils daily. 48 mL 12   GARLIC 0086 PO Take by mouth.     glucose blood test strip USE TO TEST BLOOD SUGAR LEVELS ONE TO TWO TIMES A WEEK 100 each 12   MULTIPLE VITAMIN PO Take by mouth.     psyllium (METAMUCIL) 58.6 % powder Take 1 packet by mouth 3 (three) times daily. (Patient not taking: Reported on 06/17/2022)     rosuvastatin (CRESTOR) 5 MG tablet TAKE 1 TABLET (5 MG TOTAL) BY MOUTH DAILY. 90 tablet 2   Zinc 100 MG TABS Take by mouth.     No current facility-administered medications for this visit.    Patient confirms/reports the following allergies:  No Known Allergies  No orders of the defined types were placed in this encounter.   AUTHORIZATION  INFORMATION Primary Insurance: 1D#: Group #:  Secondary Insurance: 1D#: Group #:  SCHEDULE INFORMATION: Date: 09/27/22 Time: Location: [place]

## 2022-09-23 ENCOUNTER — Encounter: Payer: Self-pay | Admitting: Gastroenterology

## 2022-09-27 ENCOUNTER — Other Ambulatory Visit: Payer: Self-pay

## 2022-09-27 ENCOUNTER — Encounter
Admission: RE | Disposition: A | Discharge status: Home / Self Care | Payer: Self-pay | Source: Home / Self Care | Attending: Gastroenterology

## 2022-09-27 ENCOUNTER — Ambulatory Visit: Payer: BC Managed Care – PPO | Admitting: Anesthesiology

## 2022-09-27 ENCOUNTER — Encounter: Payer: Self-pay | Admitting: Gastroenterology

## 2022-09-27 ENCOUNTER — Ambulatory Visit
Admission: RE | Admit: 2022-09-27 | Discharge: 2022-09-27 | Disposition: A | Discharge status: Home / Self Care | Payer: BC Managed Care – PPO | Attending: Gastroenterology | Admitting: Gastroenterology

## 2022-09-27 DIAGNOSIS — K635 Polyp of colon: Secondary | ICD-10-CM | POA: Diagnosis not present

## 2022-09-27 DIAGNOSIS — Z860100 Personal history of colon polyps, unspecified: Secondary | ICD-10-CM

## 2022-09-27 DIAGNOSIS — K640 First degree hemorrhoids: Secondary | ICD-10-CM | POA: Insufficient documentation

## 2022-09-27 DIAGNOSIS — I10 Essential (primary) hypertension: Secondary | ICD-10-CM | POA: Insufficient documentation

## 2022-09-27 DIAGNOSIS — Z1211 Encounter for screening for malignant neoplasm of colon: Secondary | ICD-10-CM | POA: Insufficient documentation

## 2022-09-27 HISTORY — PX: COLONOSCOPY WITH PROPOFOL: SHX5780

## 2022-09-27 HISTORY — DX: Essential (primary) hypertension: I10

## 2022-09-27 SURGERY — COLONOSCOPY WITH PROPOFOL
Anesthesia: General | Site: Rectum

## 2022-09-27 MED ORDER — STERILE WATER FOR IRRIGATION IR SOLN
Status: DC | PRN
  Administered 2022-09-27: 150 mL

## 2022-09-27 MED ORDER — PROPOFOL 10 MG/ML IV BOLUS
INTRAVENOUS | Status: DC | PRN
  Administered 2022-09-27: 90 mg via INTRAVENOUS
  Administered 2022-09-27 (×2): 40 mg via INTRAVENOUS

## 2022-09-27 MED ORDER — SODIUM CHLORIDE 0.9 % IV SOLN: INTRAVENOUS | Status: DC

## 2022-09-27 MED ORDER — LIDOCAINE HCL (CARDIAC) PF 100 MG/5ML IV SOSY
PREFILLED_SYRINGE | INTRAVENOUS | Status: DC | PRN
  Administered 2022-09-27: 80 mg via INTRAVENOUS

## 2022-09-27 MED ORDER — LACTATED RINGERS IV SOLN: INTRAVENOUS | Status: DC

## 2022-09-27 SURGICAL SUPPLY — 21 items

## 2022-09-27 NOTE — Transfer of Care (Signed)
Immediate Anesthesia Transfer of Care Note  Patient: Darryl Larson  Procedure(s) Performed: COLONOSCOPY WITH PROPOFOL (Rectum)  Patient Location: PACU  Anesthesia Type: General  Level of Consciousness: awake, alert  and patient cooperative  Airway and Oxygen Therapy: Patient Spontanous Breathing and Patient connected to supplemental oxygen  Post-op Assessment: Post-op Vital signs reviewed, Patient's Cardiovascular Status Stable, Respiratory Function Stable, Patent Airway and No signs of Nausea or vomiting  Post-op Vital Signs: Reviewed and stable  Complications: No notable events documented.

## 2022-09-27 NOTE — Anesthesia Preprocedure Evaluation (Signed)
Anesthesia Evaluation  Patient identified by MRN, date of birth, ID band Patient awake    Reviewed: Allergy & Precautions, H&P , NPO status , Patient's Chart, lab work & pertinent test results, reviewed documented beta blocker date and time   History of Anesthesia Complications Negative for: history of anesthetic complications  Airway Mallampati: I  TM Distance: >3 FB Neck ROM: full    Dental  (+) Dental Advidsory Given, Partial Upper, Partial Lower   Pulmonary neg pulmonary ROS   Pulmonary exam normal breath sounds clear to auscultation       Cardiovascular Exercise Tolerance: Good hypertension, (-) angina (-) Past MI and (-) Cardiac Stents Normal cardiovascular exam(-) dysrhythmias (-) Valvular Problems/Murmurs Rhythm:regular Rate:Normal     Neuro/Psych  PSYCHIATRIC DISORDERS  Depression    negative neurological ROS     GI/Hepatic Neg liver ROS,,,  Endo/Other  diabetes (borderline)    Renal/GU negative Renal ROS  negative genitourinary   Musculoskeletal   Abdominal   Peds  Hematology negative hematology ROS (+)   Anesthesia Other Findings Past Medical History: No date: Acid reflux No date: ADD (attention deficit disorder) No date: Depression No date: Hypertension   Reproductive/Obstetrics negative OB ROS                             Anesthesia Physical Anesthesia Plan  ASA: 2  Anesthesia Plan: General   Post-op Pain Management:    Induction: Intravenous  PONV Risk Score and Plan: 2 and Propofol infusion and TIVA  Airway Management Planned: Natural Airway and Nasal Cannula  Additional Equipment:   Intra-op Plan:   Post-operative Plan:   Informed Consent: I have reviewed the patients History and Physical, chart, labs and discussed the procedure including the risks, benefits and alternatives for the proposed anesthesia with the patient or authorized representative who has  indicated his/her understanding and acceptance.     Dental Advisory Given  Plan Discussed with: Anesthesiologist, CRNA and Surgeon  Anesthesia Plan Comments:        Anesthesia Quick Evaluation

## 2022-09-27 NOTE — H&P (Signed)
Lucilla Lame, MD [address] [place], [place] [address] Phone:[phone] Fax : [phone]  Primary Care Physician:  [name]., MD Primary Gastroenterologist:  Dr. Allen Norris  Pre-Procedure History & Physical: HPI:  Darryl Larson is a 61 y.o. male is here for an colonoscopy.   Past Medical History:  Diagnosis Date   Acid reflux    ADD (attention deficit disorder)    Depression    Hypertension     Past Surgical History:  Procedure Laterality Date   EYE SURGERY Bilateral    VASECTOMY      Prior to Admission medications   Medication Sig Start Date End Date Taking? Authorizing Provider  fluticasone (FLONASE) 50 MCG/ACT nasal spray Place 2 sprays into both nostrils daily. 03/12/22  Yes [name]., MD  GARLIC 4496 PO Take by mouth.   Yes [provider]  glucose blood test strip USE TO TEST BLOOD SUGAR LEVELS ONE TO TWO TIMES A WEEK 11/20/20  Yes [name]., MD  MULTIPLE VITAMIN PO Take by mouth. 01/23/12  Yes [provider]  rosuvastatin (CRESTOR) 5 MG tablet TAKE 1 TABLET (5 MG TOTAL) BY MOUTH DAILY. 06/10/22  Yes [name]., MD  Zinc 100 MG TABS Take by mouth.   Yes [provider]  Ascorbic Acid (VITAMIN C) 500 MG CHEW Take 500 mg by mouth daily. Patient not taking: Reported on 06/17/2022    [provider]  brimonidine (ALPHAGAN) 0.2 % ophthalmic solution Place 1 drop into the right eye 3 (three) times daily. Patient not taking: Reported on 09/23/2022 06/09/22   [provider]  CINNAMON PO Take by mouth daily. Patient not taking: Reported on 09/23/2022    [provider]  psyllium (METAMUCIL) 58.6 % powder Take 1 packet by mouth 3 (three) times daily. Patient not taking: Reported on 06/17/2022    [provider]    Allergies as of 09/11/2022   (No Known Allergies)    Family History  Problem Relation Age of Onset   Breast cancer Mother    Diabetes  Mother    Pancreatic cancer Mother    Throat cancer Father    Alcohol abuse Father    Depression Father    Healthy Brother    Healthy Daughter    Hodgkin's lymphoma Paternal Uncle    Depression Brother    Depression Brother    Cancer Paternal Aunt    Prostate cancer Neg Hx     Social History   Socioeconomic History   Marital status: Divorced    Spouse name: Not on file   Number of children: Not on file   Years of education: Not on file   Highest education level: Not on file  Occupational History   Not on file  Tobacco Use   Smoking status: Never   Smokeless tobacco: Former    Types: Nurse, children's Use: Never used  Substance and Sexual Activity   Alcohol use: Yes    Alcohol/week: 0.0 standard drinks of alcohol    Comment: 3 to 4 drinks a week   Drug use: No   Sexual activity: Yes    Birth control/protection: Surgical  Other Topics Concern   Not on file  Social History Narrative   Not on file   Social Determinants of Health   Financial Resource Strain: Not on file  Food Insecurity: Not on file  Transportation Needs: Not on file  Physical Activity: Not on file  Stress: Not on file  Social Connections: Not on file  Intimate Partner Violence: Not on file    Review of Systems: See HPI, otherwise negative ROS  Physical Exam: BP 127/82   Pulse (!) 53   Temp 98.4 F (36.9 C) (Temporal)   Resp 16   Ht '5\' 5"'$  (1.651 m)   Wt 71.7 kg   SpO2 97%   BMI 26.29 kg/m  General:   Alert,  pleasant and cooperative in NAD Head:  Normocephalic and atraumatic. Neck:  Supple; no masses or thyromegaly. Lungs:  Clear throughout to auscultation.    Heart:  Regular rate and rhythm. Abdomen:  Soft, nontender and nondistended. Normal bowel sounds, without guarding, and without rebound.   Neurologic:  Alert and  oriented x4;  grossly normal neurologically.  Impression/Plan: Darryl Larson is here for an colonoscopy to be performed for a history of adenomatous  polyps on 2018   Risks, benefits, limitations, and alternatives regarding  colonoscopy have been reviewed with the patient.  Questions have been answered.  All parties agreeable.   Lucilla Lame, MD  09/27/2022, 9:41 AM

## 2022-09-27 NOTE — Op Note (Signed)
[facility] Patient Name: Darryl Larson Procedure Date: 09/27/2022 10:07 AM MRN: [id number] Account #: [id number] Date of Birth: 10/09/1961 Admit Type: Outpatient Age: 61 Room: [facility] OR ROOM 01 Gender: Male Note Status: Finalized Instrument Name: [id number] Procedure:             Colonoscopy Indications:           High risk colon cancer surveillance: Personal history                         of colonic polyps Providers:             Lucilla Lame MD, MD Referring MD:          Janine Ores. Rosanna Randy, MD (Referring MD) Medicines:             Propofol per Anesthesia Complications:         No immediate complications. Procedure:             Pre-Anesthesia Assessment:                        - Prior to the procedure, a History and Physical was                         performed, and patient medications and allergies were                         reviewed. The patient's tolerance of previous                         anesthesia was also reviewed. The risks and benefits                         of the procedure and the sedation options and risks                         were discussed with the patient. All questions were                         answered, and informed consent was obtained. Prior                         Anticoagulants: The patient has taken no anticoagulant                         or antiplatelet agents. ASA Grade Assessment: II - A                         patient with mild systemic disease. After reviewing                         the risks and benefits, the patient was deemed in                         satisfactory condition to undergo the procedure.                        After obtaining informed consent, the colonoscope was  passed under direct vision. Throughout the procedure,                         the patient's blood pressure, pulse, and oxygen                         saturations were monitored continuously. The                          Colonoscope was introduced through the anus and                         advanced to the the cecum, identified by appendiceal                         orifice and ileocecal valve. The colonoscopy was                         performed without difficulty. The patient tolerated                         the procedure well. The quality of the bowel                         preparation was excellent. Findings:      The perianal and digital rectal examinations were normal.      A 2 mm polyp was found in the cecum. The polyp was sessile. The polyp       was removed with a cold biopsy forceps. Resection and retrieval were       complete.      A 5 mm polyp was found in the transverse colon. The polyp was sessile.       The polyp was removed with a cold snare. Resection and retrieval were       complete.      Non-bleeding internal hemorrhoids were found during retroflexion. The       hemorrhoids were Grade I (internal hemorrhoids that do not prolapse). Impression:            - One 2 mm polyp in the cecum, removed with a cold                         biopsy forceps. Resected and retrieved.                        - One 5 mm polyp in the transverse colon, removed with                         a cold snare. Resected and retrieved.                        - Non-bleeding internal hemorrhoids. Recommendation:        - Discharge patient to home.                        - Resume previous diet.                        - Continue present medications.                        -  Await pathology results.                        - Repeat colonoscopy in 5 years for surveillance. Procedure Code(s):     --- Professional ---                        [phone], Colonoscopy, flexible; with removal of                         tumor(s), polyp(s), or other lesion(s) by snare                         technique                        [id number], 62, Colonoscopy, flexible; with biopsy, single                         or multiple Diagnosis  Code(s):     --- Professional ---                        Z86.010, Personal history of colonic polyps                        D12.0, Benign neoplasm of cecum CPT copyright 2022 American Medical Association. All rights reserved. The codes documented in this report are preliminary and upon coder review may  be revised to meet current compliance requirements. Lucilla Lame MD, MD 09/27/2022 10:32:49 AM This report has been signed electronically. Number of Addenda: 0 Note Initiated On: 09/27/2022 10:07 AM Scope Withdrawal Time: 0 hours 8 minutes 18 seconds  Total Procedure Duration: 0 hours 12 minutes 12 seconds  Estimated Blood Loss:  Estimated blood loss: none.      [facility]

## 2022-09-27 NOTE — Anesthesia Postprocedure Evaluation (Signed)
Anesthesia Post Note  Patient: KEMON DEVINCENZI  Procedure(s) Performed: COLONOSCOPY WITH PROPOFOL (Rectum)  Patient location during evaluation: PACU Anesthesia Type: General Level of consciousness: awake and alert Pain management: pain level controlled Vital Signs Assessment: post-procedure vital signs reviewed and stable Respiratory status: spontaneous breathing, nonlabored ventilation, respiratory function stable and patient connected to nasal cannula oxygen Cardiovascular status: blood pressure returned to baseline and stable Postop Assessment: no apparent nausea or vomiting Anesthetic complications: no   No notable events documented.   Last Vitals:  Vitals:   09/27/22 1034 09/27/22 1045  BP: 95/68 109/80  Pulse:    Resp: 18 20  Temp: (!) 36.4 C (!) 36.4 C  SpO2: 95% 95%    Last Pain:  Vitals:   09/27/22 1045  TempSrc:   PainSc: 0-No pain                 Martha Clan

## 2022-09-30 ENCOUNTER — Encounter: Payer: Self-pay | Admitting: Gastroenterology

## 2022-10-01 LAB — SURGICAL PATHOLOGY

## 2022-10-02 ENCOUNTER — Encounter: Payer: Self-pay | Admitting: Gastroenterology

## 2022-10-16 ENCOUNTER — Ambulatory Visit
Admission: EM | Admit: 2022-10-16 | Discharge: 2022-10-16 | Disposition: A | Discharge status: Home / Self Care | Payer: BC Managed Care – PPO | Attending: Internal Medicine | Admitting: Internal Medicine

## 2022-10-16 DIAGNOSIS — Z1152 Encounter for screening for COVID-19: Secondary | ICD-10-CM | POA: Diagnosis not present

## 2022-10-16 DIAGNOSIS — R509 Fever, unspecified: Secondary | ICD-10-CM

## 2022-10-16 DIAGNOSIS — R051 Acute cough: Secondary | ICD-10-CM | POA: Diagnosis not present

## 2022-10-16 DIAGNOSIS — B349 Viral infection, unspecified: Secondary | ICD-10-CM | POA: Diagnosis not present

## 2022-10-16 DIAGNOSIS — R0981 Nasal congestion: Secondary | ICD-10-CM | POA: Diagnosis not present

## 2022-10-16 DIAGNOSIS — J101 Influenza due to other identified influenza virus with other respiratory manifestations: Secondary | ICD-10-CM | POA: Diagnosis not present

## 2022-10-16 DIAGNOSIS — J029 Acute pharyngitis, unspecified: Secondary | ICD-10-CM | POA: Diagnosis present

## 2022-10-16 DIAGNOSIS — R059 Cough, unspecified: Secondary | ICD-10-CM | POA: Diagnosis present

## 2022-10-16 LAB — GROUP A STREP BY PCR: Group A Strep by PCR: NOT DETECTED

## 2022-10-16 LAB — RESP PANEL BY RT-PCR (RSV, FLU A&B, COVID)  RVPGX2
Influenza A by PCR: POSITIVE — AB
Influenza B by PCR: NEGATIVE
Resp Syncytial Virus by PCR: NEGATIVE
SARS Coronavirus 2 by RT PCR: NEGATIVE

## 2022-10-16 MED ORDER — OSELTAMIVIR PHOSPHATE 75 MG PO CAPS: 75.0000 mg | ORAL_CAPSULE | Freq: Two times a day (BID) | ORAL | 0 refills | Status: AC

## 2022-10-16 NOTE — ED Triage Notes (Signed)
Pt c/o cough,congestion,fever & sore throat x2 days. Has been around sick son.

## 2022-10-16 NOTE — ED Provider Notes (Signed)
[facility]    CSN: [id number] Arrival date & time: 10/16/22  1017      History   Chief Complaint Chief Complaint  Patient presents with   Cough   Nasal Congestion   Fever   Sore Throat         HPI Darryl Larson is a 61 y.o. male presenting for 2-day history of fever up to 101.5 degrees, fatigue, body aches, cough, congestion and scratchy throat.  Reports that he saw his son on Christmas Day and his son was a little sick.  Patient has taken OTC meds for symptoms.  Took acetaminophen before arrival to urgent care today.  He is not complaining of any weakness, shortness of breath, vomiting or diarrhea.  He did not take a COVID test at home.  He has no other complaints or concerns.  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   Hypnapompic hallucinations 04/05/2021   Adult attention deficit disorder 07/18/2015   Clinical depression 07/18/2015   Borderline diabetes 07/18/2015   Blood glucose elevated 07/18/2015   HLD (hyperlipidemia) 07/18/2015   Lipoma of shoulder 07/18/2015    Past Surgical History:  Procedure Laterality Date   COLONOSCOPY WITH PROPOFOL N/A 09/27/2022   Procedure: COLONOSCOPY WITH PROPOFOL;  Surgeon: Lucilla Lame, MD;  Location: [place];  Service: Endoscopy;  Laterality: N/A;   EYE SURGERY Bilateral    VASECTOMY         Home Medications    Prior to Admission medications   Medication Sig Start Date End Date Taking? Authorizing Provider  fluticasone (FLONASE) 50 MCG/ACT nasal spray Place 2 sprays into both nostrils daily. 03/12/22  Yes [name]., MD  GARLIC 5176 PO Take by mouth.   Yes [provider]  glucose blood test strip USE TO TEST BLOOD SUGAR LEVELS ONE TO TWO TIMES A WEEK 11/20/20  Yes [name]., MD  MULTIPLE VITAMIN PO Take by mouth. 01/23/12  Yes [provider]  oseltamivir (TAMIFLU) 75 MG capsule Take 1 capsule (75 mg total) by mouth every 12 (twelve) hours for 5 days. 10/16/22 10/21/22 Yes Laurene Footman B, PA-C  rosuvastatin (CRESTOR) 5 MG tablet TAKE 1 TABLET (5 MG TOTAL) BY MOUTH DAILY. 06/10/22  Yes [name]., MD  Zinc 100 MG TABS Take by mouth.   Yes [provider]  Ascorbic Acid (VITAMIN C) 500 MG CHEW Take 500 mg by mouth daily. Patient not taking: Reported on 06/17/2022    [provider]  brimonidine (ALPHAGAN) 0.2 % ophthalmic solution Place 1 drop into the right eye 3 (three) times daily. Patient not taking: Reported on 09/23/2022 06/09/22   [provider]  CINNAMON PO Take by mouth daily. Patient not taking: Reported on 09/23/2022    [provider]  psyllium (METAMUCIL) 58.6 % powder Take 1 packet by mouth 3 (three) times daily. Patient not taking: Reported on 06/17/2022    [provider]    Family History Family History  Problem Relation Age of Onset   Breast cancer Mother  Diabetes Mother    Pancreatic cancer Mother    Throat cancer Father    Alcohol abuse Father    Depression Father    Healthy Brother    Healthy Daughter    Hodgkin's lymphoma Paternal Uncle    Depression Brother    Depression Brother    Cancer Paternal Aunt    Prostate cancer Neg Hx     Social History Social History   Tobacco Use   Smoking status: Never   Smokeless tobacco: Former    Types: Nurse, children's Use: Never used  Substance Use Topics   Alcohol use: Yes    Alcohol/week: 0.0 standard drinks of alcohol    Comment: 3 to 4 drinks a week   Drug use: No     Allergies   Patient has no known allergies.   Review  of Systems Review of Systems  Constitutional:  Positive for fatigue and fever.  HENT:  Positive for congestion, rhinorrhea and sore throat. Negative for sinus pressure and sinus pain.   Respiratory:  Positive for cough. Negative for shortness of breath.   Gastrointestinal:  Negative for abdominal pain, diarrhea, nausea and vomiting.  Musculoskeletal:  Positive for myalgias.  Neurological:  Negative for weakness, light-headedness and headaches.  Hematological:  Negative for adenopathy.     Physical Exam Triage Vital Signs ED Triage Vitals  Enc Vitals Group     BP 10/16/22 1153 138/73     Pulse Rate 10/16/22 1153 (!) 55     Resp 10/16/22 1153 16     Temp 10/16/22 1153 98.7 F (37.1 C)     Temp Source 10/16/22 1153 Oral     SpO2 10/16/22 1153 98 %     Weight 10/16/22 1152 160 lb (72.6 kg)     Height 10/16/22 1152 '5\' 5"'$  (1.651 m)     Head Circumference --      Peak Flow --      Pain Score 10/16/22 1151 5     Pain Loc --      Pain Edu? --      Excl. in [place]? --    No data found.  Updated Vital Signs BP 138/73 (BP Location: Left Arm)   Pulse (!) 55   Temp 98.7 F (37.1 C) (Oral)   Resp 16   Ht '5\' 5"'$  (1.651 m)   Wt 160 lb (72.6 kg)   SpO2 98%   BMI 26.63 kg/m      Physical Exam Vitals and nursing note reviewed.  Constitutional:      General: He is not in acute distress.    Appearance: Normal appearance. He is well-developed. He is not ill-appearing.  HENT:     Head: Normocephalic and atraumatic.     Nose: Congestion present.     Mouth/Throat:     Mouth: Mucous membranes are moist.     Pharynx: Oropharynx is clear. Posterior oropharyngeal erythema present.  Eyes:     General: No scleral icterus.    Conjunctiva/sclera: Conjunctivae normal.  Cardiovascular:     Rate and Rhythm: Regular rhythm. Bradycardia present.  Pulmonary:     Effort: Pulmonary effort is normal. No respiratory distress.     Breath sounds: Normal breath sounds.  Musculoskeletal:     Cervical  back: Neck supple.  Skin:    General: Skin is warm and dry.     Capillary Refill: Capillary refill takes less than 2 seconds.  Neurological:     General: No  focal deficit present.     Mental Status: He is alert. Mental status is at baseline.     Motor: No weakness.     Gait: Gait normal.  Psychiatric:        Mood and Affect: Mood normal.      UC Treatments / Results  Labs (all labs ordered are listed, but only abnormal results are displayed) Labs Reviewed  RESP PANEL BY RT-PCR (RSV, FLU A&B, COVID)  RVPGX2 - Abnormal; Notable for the following components:      Result Value   Influenza A by PCR POSITIVE (*)    All other components within normal limits  GROUP A STREP BY PCR    EKG   Radiology No results found.  Procedures Procedures (including critical care time)  Medications Ordered in UC Medications - No data to display  Initial Impression / Assessment and Plan / UC Course  I have reviewed the triage vital signs and the nursing notes.  Pertinent labs & imaging results that were available during my care of the patient were reviewed by me and considered in my medical decision making (see chart for details).   61 year old male presents for fever, fatigue, cough, congestion, body aches x 2 days.  Vitals are stable.  He is overall well-appearing.  Nasal congestion and mild erythema posterior pharynx on exam.  Chest clear auscultation.  PCR strep test performed and negative.  Respiratory panel obtained given that his symptoms have been within the last 2 days.  Advised patient I will contact him with the results.  For influenza A.  Called to discuss result with patient.  Sent Tamiflu to pharmacy.  He has home cough medication that he has been taking that is working well.  Reviewed supportive care.  Reviewed return precautions.  Final Clinical Impressions(s) / UC Diagnoses   Final diagnoses:  Viral illness  Fever, unspecified  Acute cough  Nasal congestion   Influenza A     Discharge Instructions      -Checking for flu, COVID and RSV since your strep was negative.  I will contact you with the results when they return in the next 45 minutes to an hour. - If you are positive for COVID you need to isolate 5 days from symptom onset and wear mask x 5 days.  The first day would technically be 12/26.  If you do have COVID we can discuss antiviral treatments. - If you have the flu you are still within the window for treatment with Tamiflu where it could help your symptoms and shorten the course by a day so I can send that to the pharmacy. - If those tests are negative then you likely have another viral illness and care is supportive with cough medications, rest and fluids, Tylenol Motrin for fever control.     ED Prescriptions     Medication Sig Dispense Auth. Provider   oseltamivir (TAMIFLU) 75 MG capsule Take 1 capsule (75 mg total) by mouth every 12 (twelve) hours for 5 days. 10 capsule Danton Clap, PA-C      PDMP not reviewed this encounter.   Danton Clap, PA-C 10/16/22 1339

## 2022-10-16 NOTE — Discharge Instructions (Signed)
-  Checking for flu, COVID and RSV since your strep was negative.  I will contact you with the results when they return in the next 45 minutes to an hour. - If you are positive for COVID you need to isolate 5 days from symptom onset and wear mask x 5 days.  The first day would technically be 12/26.  If you do have COVID we can discuss antiviral treatments. - If you have the flu you are still within the window for treatment with Tamiflu where it could help your symptoms and shorten the course by a day so I can send that to the pharmacy. - If those tests are negative then you likely have another viral illness and care is supportive with cough medications, rest and fluids, Tylenol Motrin for fever control.

## 2022-11-21 ENCOUNTER — Ambulatory Visit: Payer: BC Managed Care – PPO | Admitting: Urology

## 2024-07-02 ENCOUNTER — Other Ambulatory Visit: Payer: Self-pay | Admitting: Medical Genetics

## 2024-07-13 ENCOUNTER — Other Ambulatory Visit
